# Patient Record
Sex: Female | Born: 1935 | Race: White | Hispanic: No | Smoking: Never smoker
Health system: Southern US, Community
[De-identification: ages and names within clinical notes are randomized; demographics above are authoritative.]

## PROBLEM LIST (undated history)

## (undated) DIAGNOSIS — F329 Major depressive disorder, single episode, unspecified: Secondary | ICD-10-CM

## (undated) DIAGNOSIS — Z8701 Personal history of pneumonia (recurrent): Secondary | ICD-10-CM

## (undated) DIAGNOSIS — M1990 Unspecified osteoarthritis, unspecified site: Secondary | ICD-10-CM

## (undated) DIAGNOSIS — E7800 Pure hypercholesterolemia, unspecified: Secondary | ICD-10-CM

## (undated) DIAGNOSIS — E119 Type 2 diabetes mellitus without complications: Secondary | ICD-10-CM

## (undated) DIAGNOSIS — J189 Pneumonia, unspecified organism: Secondary | ICD-10-CM

## (undated) DIAGNOSIS — G709 Myoneural disorder, unspecified: Secondary | ICD-10-CM

## (undated) DIAGNOSIS — C801 Malignant (primary) neoplasm, unspecified: Secondary | ICD-10-CM

## (undated) DIAGNOSIS — F419 Anxiety disorder, unspecified: Secondary | ICD-10-CM

## (undated) DIAGNOSIS — H3530 Unspecified macular degeneration: Secondary | ICD-10-CM

## (undated) DIAGNOSIS — F32A Depression, unspecified: Secondary | ICD-10-CM

## (undated) HISTORY — PX: TOTAL HIP ARTHROPLASTY: SHX124

## (undated) HISTORY — DX: Depression, unspecified: F32.A

## (undated) HISTORY — PX: JOINT REPLACEMENT: SHX530

## (undated) HISTORY — PX: COLONOSCOPY: SHX174

## (undated) HISTORY — PX: TUBAL LIGATION: SHX77

## (undated) HISTORY — DX: Pure hypercholesterolemia, unspecified: E78.00

## (undated) HISTORY — DX: Anxiety disorder, unspecified: F41.9

## (undated) HISTORY — DX: Type 2 diabetes mellitus without complications: E11.9

## (undated) HISTORY — PX: APPENDECTOMY: SHX54

## (undated) HISTORY — DX: Unspecified macular degeneration: H35.30

## (undated) HISTORY — DX: Major depressive disorder, single episode, unspecified: F32.9

## (undated) HISTORY — PX: REPLACEMENT TOTAL KNEE: SUR1224

## (undated) HISTORY — PX: BACK SURGERY: SHX140

---

## 2014-09-21 DIAGNOSIS — F411 Generalized anxiety disorder: Secondary | ICD-10-CM | POA: Diagnosis not present

## 2014-09-21 DIAGNOSIS — E785 Hyperlipidemia, unspecified: Secondary | ICD-10-CM | POA: Diagnosis not present

## 2014-09-21 DIAGNOSIS — F3341 Major depressive disorder, recurrent, in partial remission: Secondary | ICD-10-CM | POA: Diagnosis not present

## 2014-09-21 DIAGNOSIS — Z9181 History of falling: Secondary | ICD-10-CM | POA: Diagnosis not present

## 2014-09-21 DIAGNOSIS — H3530 Unspecified macular degeneration: Secondary | ICD-10-CM | POA: Diagnosis not present

## 2014-09-21 DIAGNOSIS — E119 Type 2 diabetes mellitus without complications: Secondary | ICD-10-CM | POA: Diagnosis not present

## 2014-09-21 DIAGNOSIS — Z0000 Encounter for general adult medical examination without abnormal findings: Secondary | ICD-10-CM | POA: Diagnosis not present

## 2014-09-30 DIAGNOSIS — H3531 Nonexudative age-related macular degeneration: Secondary | ICD-10-CM | POA: Diagnosis not present

## 2014-09-30 DIAGNOSIS — H53413 Scotoma involving central area, bilateral: Secondary | ICD-10-CM | POA: Diagnosis not present

## 2014-10-11 DIAGNOSIS — H1851 Endothelial corneal dystrophy: Secondary | ICD-10-CM | POA: Diagnosis not present

## 2014-10-11 DIAGNOSIS — H2511 Age-related nuclear cataract, right eye: Secondary | ICD-10-CM | POA: Diagnosis not present

## 2014-10-11 DIAGNOSIS — H1713 Central corneal opacity, bilateral: Secondary | ICD-10-CM | POA: Diagnosis not present

## 2014-10-11 DIAGNOSIS — E119 Type 2 diabetes mellitus without complications: Secondary | ICD-10-CM | POA: Diagnosis not present

## 2014-10-11 DIAGNOSIS — H3531 Nonexudative age-related macular degeneration: Secondary | ICD-10-CM | POA: Diagnosis not present

## 2014-10-11 DIAGNOSIS — H16223 Keratoconjunctivitis sicca, not specified as Sjogren's, bilateral: Secondary | ICD-10-CM | POA: Diagnosis not present

## 2014-10-25 DIAGNOSIS — H53419 Scotoma involving central area, unspecified eye: Secondary | ICD-10-CM | POA: Diagnosis not present

## 2014-10-25 DIAGNOSIS — H3531 Nonexudative age-related macular degeneration: Secondary | ICD-10-CM | POA: Diagnosis not present

## 2014-10-27 DIAGNOSIS — R260 Ataxic gait: Secondary | ICD-10-CM | POA: Diagnosis not present

## 2014-10-27 DIAGNOSIS — R531 Weakness: Secondary | ICD-10-CM | POA: Diagnosis not present

## 2014-10-27 DIAGNOSIS — J209 Acute bronchitis, unspecified: Secondary | ICD-10-CM | POA: Diagnosis not present

## 2014-10-27 DIAGNOSIS — F418 Other specified anxiety disorders: Secondary | ICD-10-CM | POA: Diagnosis not present

## 2014-10-27 DIAGNOSIS — R262 Difficulty in walking, not elsewhere classified: Secondary | ICD-10-CM | POA: Diagnosis not present

## 2014-10-27 DIAGNOSIS — F411 Generalized anxiety disorder: Secondary | ICD-10-CM | POA: Diagnosis not present

## 2014-10-27 DIAGNOSIS — R1011 Right upper quadrant pain: Secondary | ICD-10-CM | POA: Diagnosis not present

## 2014-10-27 DIAGNOSIS — M791 Myalgia: Secondary | ICD-10-CM | POA: Diagnosis not present

## 2014-10-27 DIAGNOSIS — Z9181 History of falling: Secondary | ICD-10-CM | POA: Diagnosis not present

## 2014-10-27 DIAGNOSIS — E119 Type 2 diabetes mellitus without complications: Secondary | ICD-10-CM | POA: Diagnosis not present

## 2014-11-04 DIAGNOSIS — J4521 Mild intermittent asthma with (acute) exacerbation: Secondary | ICD-10-CM | POA: Diagnosis not present

## 2014-11-04 DIAGNOSIS — J209 Acute bronchitis, unspecified: Secondary | ICD-10-CM | POA: Diagnosis not present

## 2014-11-04 DIAGNOSIS — J45901 Unspecified asthma with (acute) exacerbation: Secondary | ICD-10-CM | POA: Diagnosis not present

## 2014-11-04 DIAGNOSIS — R0989 Other specified symptoms and signs involving the circulatory and respiratory systems: Secondary | ICD-10-CM | POA: Diagnosis not present

## 2014-11-22 DIAGNOSIS — J301 Allergic rhinitis due to pollen: Secondary | ICD-10-CM | POA: Diagnosis not present

## 2014-11-22 DIAGNOSIS — R05 Cough: Secondary | ICD-10-CM | POA: Diagnosis not present

## 2015-01-17 DIAGNOSIS — H25811 Combined forms of age-related cataract, right eye: Secondary | ICD-10-CM | POA: Diagnosis not present

## 2015-01-21 DIAGNOSIS — H2511 Age-related nuclear cataract, right eye: Secondary | ICD-10-CM | POA: Diagnosis not present

## 2015-01-21 DIAGNOSIS — Z961 Presence of intraocular lens: Secondary | ICD-10-CM | POA: Diagnosis not present

## 2015-01-21 DIAGNOSIS — H3530 Unspecified macular degeneration: Secondary | ICD-10-CM | POA: Diagnosis not present

## 2015-01-24 DIAGNOSIS — L84 Corns and callosities: Secondary | ICD-10-CM | POA: Diagnosis not present

## 2015-01-24 DIAGNOSIS — B351 Tinea unguium: Secondary | ICD-10-CM | POA: Diagnosis not present

## 2015-01-24 DIAGNOSIS — E1140 Type 2 diabetes mellitus with diabetic neuropathy, unspecified: Secondary | ICD-10-CM | POA: Diagnosis not present

## 2015-01-27 DIAGNOSIS — H53413 Scotoma involving central area, bilateral: Secondary | ICD-10-CM | POA: Diagnosis not present

## 2015-01-27 DIAGNOSIS — H3531 Nonexudative age-related macular degeneration: Secondary | ICD-10-CM | POA: Diagnosis not present

## 2015-01-31 DIAGNOSIS — J4521 Mild intermittent asthma with (acute) exacerbation: Secondary | ICD-10-CM | POA: Diagnosis not present

## 2015-01-31 DIAGNOSIS — J301 Allergic rhinitis due to pollen: Secondary | ICD-10-CM | POA: Diagnosis not present

## 2015-01-31 DIAGNOSIS — E782 Mixed hyperlipidemia: Secondary | ICD-10-CM | POA: Diagnosis not present

## 2015-01-31 DIAGNOSIS — F411 Generalized anxiety disorder: Secondary | ICD-10-CM | POA: Diagnosis not present

## 2015-01-31 DIAGNOSIS — E119 Type 2 diabetes mellitus without complications: Secondary | ICD-10-CM | POA: Diagnosis not present

## 2015-02-02 DIAGNOSIS — E782 Mixed hyperlipidemia: Secondary | ICD-10-CM | POA: Diagnosis not present

## 2015-02-02 DIAGNOSIS — E119 Type 2 diabetes mellitus without complications: Secondary | ICD-10-CM | POA: Diagnosis not present

## 2015-04-26 DIAGNOSIS — E119 Type 2 diabetes mellitus without complications: Secondary | ICD-10-CM | POA: Diagnosis not present

## 2015-04-26 DIAGNOSIS — S61511A Laceration without foreign body of right wrist, initial encounter: Secondary | ICD-10-CM | POA: Diagnosis not present

## 2015-04-26 DIAGNOSIS — M25562 Pain in left knee: Secondary | ICD-10-CM | POA: Diagnosis not present

## 2015-05-03 DIAGNOSIS — M1712 Unilateral primary osteoarthritis, left knee: Secondary | ICD-10-CM | POA: Diagnosis not present

## 2015-05-10 DIAGNOSIS — M1712 Unilateral primary osteoarthritis, left knee: Secondary | ICD-10-CM | POA: Diagnosis not present

## 2015-05-18 DIAGNOSIS — F418 Other specified anxiety disorders: Secondary | ICD-10-CM | POA: Diagnosis not present

## 2015-05-18 DIAGNOSIS — M1712 Unilateral primary osteoarthritis, left knee: Secondary | ICD-10-CM | POA: Diagnosis not present

## 2015-05-18 DIAGNOSIS — E780 Pure hypercholesterolemia: Secondary | ICD-10-CM | POA: Diagnosis not present

## 2015-05-18 DIAGNOSIS — E119 Type 2 diabetes mellitus without complications: Secondary | ICD-10-CM | POA: Diagnosis not present

## 2015-05-25 DIAGNOSIS — L817 Pigmented purpuric dermatosis: Secondary | ICD-10-CM | POA: Diagnosis not present

## 2015-05-25 DIAGNOSIS — H31113 Age-related choroidal atrophy, bilateral: Secondary | ICD-10-CM | POA: Diagnosis not present

## 2015-05-25 DIAGNOSIS — H3531 Nonexudative age-related macular degeneration: Secondary | ICD-10-CM | POA: Diagnosis not present

## 2015-05-25 DIAGNOSIS — H2511 Age-related nuclear cataract, right eye: Secondary | ICD-10-CM | POA: Diagnosis not present

## 2015-05-25 DIAGNOSIS — Z85828 Personal history of other malignant neoplasm of skin: Secondary | ICD-10-CM | POA: Diagnosis not present

## 2015-05-25 DIAGNOSIS — H3532 Exudative age-related macular degeneration: Secondary | ICD-10-CM | POA: Diagnosis not present

## 2015-05-30 DIAGNOSIS — E780 Pure hypercholesterolemia: Secondary | ICD-10-CM | POA: Diagnosis not present

## 2015-05-30 DIAGNOSIS — E119 Type 2 diabetes mellitus without complications: Secondary | ICD-10-CM | POA: Diagnosis not present

## 2015-05-30 DIAGNOSIS — F418 Other specified anxiety disorders: Secondary | ICD-10-CM | POA: Diagnosis not present

## 2016-08-05 DIAGNOSIS — E119 Type 2 diabetes mellitus without complications: Secondary | ICD-10-CM | POA: Diagnosis not present

## 2016-08-05 DIAGNOSIS — Z23 Encounter for immunization: Secondary | ICD-10-CM | POA: Diagnosis not present

## 2016-08-05 DIAGNOSIS — Z794 Long term (current) use of insulin: Secondary | ICD-10-CM | POA: Diagnosis not present

## 2016-08-05 DIAGNOSIS — E782 Mixed hyperlipidemia: Secondary | ICD-10-CM | POA: Diagnosis not present

## 2016-08-05 DIAGNOSIS — Z0000 Encounter for general adult medical examination without abnormal findings: Secondary | ICD-10-CM | POA: Diagnosis not present

## 2016-08-06 DIAGNOSIS — H3530 Unspecified macular degeneration: Secondary | ICD-10-CM | POA: Diagnosis not present

## 2016-08-06 DIAGNOSIS — R296 Repeated falls: Secondary | ICD-10-CM | POA: Diagnosis not present

## 2016-08-06 DIAGNOSIS — E119 Type 2 diabetes mellitus without complications: Secondary | ICD-10-CM | POA: Diagnosis not present

## 2016-08-06 DIAGNOSIS — F329 Major depressive disorder, single episode, unspecified: Secondary | ICD-10-CM | POA: Diagnosis not present

## 2016-08-06 DIAGNOSIS — J4520 Mild intermittent asthma, uncomplicated: Secondary | ICD-10-CM | POA: Diagnosis not present

## 2016-08-06 DIAGNOSIS — L03012 Cellulitis of left finger: Secondary | ICD-10-CM | POA: Diagnosis not present

## 2016-08-06 DIAGNOSIS — R2681 Unsteadiness on feet: Secondary | ICD-10-CM | POA: Diagnosis not present

## 2016-08-06 DIAGNOSIS — F411 Generalized anxiety disorder: Secondary | ICD-10-CM | POA: Diagnosis not present

## 2016-08-07 DIAGNOSIS — J4520 Mild intermittent asthma, uncomplicated: Secondary | ICD-10-CM | POA: Diagnosis not present

## 2016-08-07 DIAGNOSIS — M1712 Unilateral primary osteoarthritis, left knee: Secondary | ICD-10-CM | POA: Diagnosis not present

## 2016-08-07 DIAGNOSIS — H3530 Unspecified macular degeneration: Secondary | ICD-10-CM | POA: Diagnosis not present

## 2016-08-07 DIAGNOSIS — R2681 Unsteadiness on feet: Secondary | ICD-10-CM | POA: Diagnosis not present

## 2016-08-07 DIAGNOSIS — M545 Low back pain: Secondary | ICD-10-CM | POA: Diagnosis not present

## 2016-08-07 DIAGNOSIS — R296 Repeated falls: Secondary | ICD-10-CM | POA: Diagnosis not present

## 2016-08-07 DIAGNOSIS — E119 Type 2 diabetes mellitus without complications: Secondary | ICD-10-CM | POA: Diagnosis not present

## 2016-08-07 DIAGNOSIS — L03012 Cellulitis of left finger: Secondary | ICD-10-CM | POA: Diagnosis not present

## 2016-08-12 DIAGNOSIS — L03012 Cellulitis of left finger: Secondary | ICD-10-CM | POA: Diagnosis not present

## 2016-08-12 DIAGNOSIS — E119 Type 2 diabetes mellitus without complications: Secondary | ICD-10-CM | POA: Diagnosis not present

## 2016-08-12 DIAGNOSIS — J4520 Mild intermittent asthma, uncomplicated: Secondary | ICD-10-CM | POA: Diagnosis not present

## 2016-08-12 DIAGNOSIS — R2681 Unsteadiness on feet: Secondary | ICD-10-CM | POA: Diagnosis not present

## 2016-08-12 DIAGNOSIS — H3530 Unspecified macular degeneration: Secondary | ICD-10-CM | POA: Diagnosis not present

## 2016-08-12 DIAGNOSIS — H353222 Exudative age-related macular degeneration, left eye, with inactive choroidal neovascularization: Secondary | ICD-10-CM | POA: Diagnosis not present

## 2016-08-12 DIAGNOSIS — H43811 Vitreous degeneration, right eye: Secondary | ICD-10-CM | POA: Diagnosis not present

## 2016-08-12 DIAGNOSIS — R296 Repeated falls: Secondary | ICD-10-CM | POA: Diagnosis not present

## 2016-08-12 DIAGNOSIS — H353211 Exudative age-related macular degeneration, right eye, with active choroidal neovascularization: Secondary | ICD-10-CM | POA: Diagnosis not present

## 2016-08-13 DIAGNOSIS — M48061 Spinal stenosis, lumbar region without neurogenic claudication: Secondary | ICD-10-CM | POA: Diagnosis not present

## 2016-08-13 DIAGNOSIS — M47817 Spondylosis without myelopathy or radiculopathy, lumbosacral region: Secondary | ICD-10-CM | POA: Diagnosis not present

## 2016-08-13 DIAGNOSIS — M25551 Pain in right hip: Secondary | ICD-10-CM | POA: Diagnosis not present

## 2016-08-13 DIAGNOSIS — M79604 Pain in right leg: Secondary | ICD-10-CM | POA: Diagnosis not present

## 2016-08-13 DIAGNOSIS — H353211 Exudative age-related macular degeneration, right eye, with active choroidal neovascularization: Secondary | ICD-10-CM | POA: Diagnosis not present

## 2016-08-13 DIAGNOSIS — H53413 Scotoma involving central area, bilateral: Secondary | ICD-10-CM | POA: Diagnosis not present

## 2016-08-13 DIAGNOSIS — M545 Low back pain: Secondary | ICD-10-CM | POA: Diagnosis not present

## 2016-08-13 DIAGNOSIS — M47816 Spondylosis without myelopathy or radiculopathy, lumbar region: Secondary | ICD-10-CM | POA: Diagnosis not present

## 2016-08-14 DIAGNOSIS — M1712 Unilateral primary osteoarthritis, left knee: Secondary | ICD-10-CM | POA: Diagnosis not present

## 2016-08-15 DIAGNOSIS — E119 Type 2 diabetes mellitus without complications: Secondary | ICD-10-CM | POA: Diagnosis not present

## 2016-08-15 DIAGNOSIS — J4520 Mild intermittent asthma, uncomplicated: Secondary | ICD-10-CM | POA: Diagnosis not present

## 2016-08-15 DIAGNOSIS — R2681 Unsteadiness on feet: Secondary | ICD-10-CM | POA: Diagnosis not present

## 2016-08-15 DIAGNOSIS — H3530 Unspecified macular degeneration: Secondary | ICD-10-CM | POA: Diagnosis not present

## 2016-08-15 DIAGNOSIS — R296 Repeated falls: Secondary | ICD-10-CM | POA: Diagnosis not present

## 2016-08-15 DIAGNOSIS — L03012 Cellulitis of left finger: Secondary | ICD-10-CM | POA: Diagnosis not present

## 2016-08-19 DIAGNOSIS — E119 Type 2 diabetes mellitus without complications: Secondary | ICD-10-CM | POA: Diagnosis not present

## 2016-08-19 DIAGNOSIS — H3530 Unspecified macular degeneration: Secondary | ICD-10-CM | POA: Diagnosis not present

## 2016-08-19 DIAGNOSIS — L03012 Cellulitis of left finger: Secondary | ICD-10-CM | POA: Diagnosis not present

## 2016-08-19 DIAGNOSIS — R2681 Unsteadiness on feet: Secondary | ICD-10-CM | POA: Diagnosis not present

## 2016-08-19 DIAGNOSIS — J4520 Mild intermittent asthma, uncomplicated: Secondary | ICD-10-CM | POA: Diagnosis not present

## 2016-08-19 DIAGNOSIS — R296 Repeated falls: Secondary | ICD-10-CM | POA: Diagnosis not present

## 2016-08-20 DIAGNOSIS — R296 Repeated falls: Secondary | ICD-10-CM | POA: Diagnosis not present

## 2016-08-20 DIAGNOSIS — L03012 Cellulitis of left finger: Secondary | ICD-10-CM | POA: Diagnosis not present

## 2016-08-20 DIAGNOSIS — J4520 Mild intermittent asthma, uncomplicated: Secondary | ICD-10-CM | POA: Diagnosis not present

## 2016-08-20 DIAGNOSIS — R2681 Unsteadiness on feet: Secondary | ICD-10-CM | POA: Diagnosis not present

## 2016-08-20 DIAGNOSIS — E119 Type 2 diabetes mellitus without complications: Secondary | ICD-10-CM | POA: Diagnosis not present

## 2016-08-20 DIAGNOSIS — H3530 Unspecified macular degeneration: Secondary | ICD-10-CM | POA: Diagnosis not present

## 2016-08-22 DIAGNOSIS — J4520 Mild intermittent asthma, uncomplicated: Secondary | ICD-10-CM | POA: Diagnosis not present

## 2016-08-22 DIAGNOSIS — E119 Type 2 diabetes mellitus without complications: Secondary | ICD-10-CM | POA: Diagnosis not present

## 2016-08-22 DIAGNOSIS — R2681 Unsteadiness on feet: Secondary | ICD-10-CM | POA: Diagnosis not present

## 2016-08-22 DIAGNOSIS — H3530 Unspecified macular degeneration: Secondary | ICD-10-CM | POA: Diagnosis not present

## 2016-08-22 DIAGNOSIS — L03012 Cellulitis of left finger: Secondary | ICD-10-CM | POA: Diagnosis not present

## 2016-08-22 DIAGNOSIS — M1712 Unilateral primary osteoarthritis, left knee: Secondary | ICD-10-CM | POA: Diagnosis not present

## 2016-08-22 DIAGNOSIS — R296 Repeated falls: Secondary | ICD-10-CM | POA: Diagnosis not present

## 2016-08-23 DIAGNOSIS — R296 Repeated falls: Secondary | ICD-10-CM | POA: Diagnosis not present

## 2016-08-23 DIAGNOSIS — L03012 Cellulitis of left finger: Secondary | ICD-10-CM | POA: Diagnosis not present

## 2016-08-23 DIAGNOSIS — J4520 Mild intermittent asthma, uncomplicated: Secondary | ICD-10-CM | POA: Diagnosis not present

## 2016-08-23 DIAGNOSIS — E119 Type 2 diabetes mellitus without complications: Secondary | ICD-10-CM | POA: Diagnosis not present

## 2016-08-23 DIAGNOSIS — H3530 Unspecified macular degeneration: Secondary | ICD-10-CM | POA: Diagnosis not present

## 2016-08-23 DIAGNOSIS — R2681 Unsteadiness on feet: Secondary | ICD-10-CM | POA: Diagnosis not present

## 2016-08-26 DIAGNOSIS — L03012 Cellulitis of left finger: Secondary | ICD-10-CM | POA: Diagnosis not present

## 2016-08-26 DIAGNOSIS — E119 Type 2 diabetes mellitus without complications: Secondary | ICD-10-CM | POA: Diagnosis not present

## 2016-08-26 DIAGNOSIS — R296 Repeated falls: Secondary | ICD-10-CM | POA: Diagnosis not present

## 2016-08-26 DIAGNOSIS — R2681 Unsteadiness on feet: Secondary | ICD-10-CM | POA: Diagnosis not present

## 2016-08-26 DIAGNOSIS — J4520 Mild intermittent asthma, uncomplicated: Secondary | ICD-10-CM | POA: Diagnosis not present

## 2016-08-26 DIAGNOSIS — H3530 Unspecified macular degeneration: Secondary | ICD-10-CM | POA: Diagnosis not present

## 2016-08-27 DIAGNOSIS — R2681 Unsteadiness on feet: Secondary | ICD-10-CM | POA: Diagnosis not present

## 2016-08-27 DIAGNOSIS — L03012 Cellulitis of left finger: Secondary | ICD-10-CM | POA: Diagnosis not present

## 2016-08-27 DIAGNOSIS — H3530 Unspecified macular degeneration: Secondary | ICD-10-CM | POA: Diagnosis not present

## 2016-08-27 DIAGNOSIS — J4520 Mild intermittent asthma, uncomplicated: Secondary | ICD-10-CM | POA: Diagnosis not present

## 2016-08-27 DIAGNOSIS — E119 Type 2 diabetes mellitus without complications: Secondary | ICD-10-CM | POA: Diagnosis not present

## 2016-08-27 DIAGNOSIS — R296 Repeated falls: Secondary | ICD-10-CM | POA: Diagnosis not present

## 2016-08-28 DIAGNOSIS — R296 Repeated falls: Secondary | ICD-10-CM | POA: Diagnosis not present

## 2016-08-28 DIAGNOSIS — M1712 Unilateral primary osteoarthritis, left knee: Secondary | ICD-10-CM | POA: Diagnosis not present

## 2016-08-28 DIAGNOSIS — H3530 Unspecified macular degeneration: Secondary | ICD-10-CM | POA: Diagnosis not present

## 2016-08-28 DIAGNOSIS — L03012 Cellulitis of left finger: Secondary | ICD-10-CM | POA: Diagnosis not present

## 2016-08-28 DIAGNOSIS — E119 Type 2 diabetes mellitus without complications: Secondary | ICD-10-CM | POA: Diagnosis not present

## 2016-08-28 DIAGNOSIS — R2681 Unsteadiness on feet: Secondary | ICD-10-CM | POA: Diagnosis not present

## 2016-08-28 DIAGNOSIS — J4520 Mild intermittent asthma, uncomplicated: Secondary | ICD-10-CM | POA: Diagnosis not present

## 2016-08-29 DIAGNOSIS — H3530 Unspecified macular degeneration: Secondary | ICD-10-CM | POA: Diagnosis not present

## 2016-08-29 DIAGNOSIS — R296 Repeated falls: Secondary | ICD-10-CM | POA: Diagnosis not present

## 2016-08-29 DIAGNOSIS — J4520 Mild intermittent asthma, uncomplicated: Secondary | ICD-10-CM | POA: Diagnosis not present

## 2016-08-29 DIAGNOSIS — E119 Type 2 diabetes mellitus without complications: Secondary | ICD-10-CM | POA: Diagnosis not present

## 2016-08-29 DIAGNOSIS — R2681 Unsteadiness on feet: Secondary | ICD-10-CM | POA: Diagnosis not present

## 2016-08-29 DIAGNOSIS — L03012 Cellulitis of left finger: Secondary | ICD-10-CM | POA: Diagnosis not present

## 2016-08-30 DIAGNOSIS — R296 Repeated falls: Secondary | ICD-10-CM | POA: Diagnosis not present

## 2016-08-30 DIAGNOSIS — L03012 Cellulitis of left finger: Secondary | ICD-10-CM | POA: Diagnosis not present

## 2016-08-30 DIAGNOSIS — J4520 Mild intermittent asthma, uncomplicated: Secondary | ICD-10-CM | POA: Diagnosis not present

## 2016-08-30 DIAGNOSIS — E119 Type 2 diabetes mellitus without complications: Secondary | ICD-10-CM | POA: Diagnosis not present

## 2016-08-30 DIAGNOSIS — R2681 Unsteadiness on feet: Secondary | ICD-10-CM | POA: Diagnosis not present

## 2016-08-30 DIAGNOSIS — H3530 Unspecified macular degeneration: Secondary | ICD-10-CM | POA: Diagnosis not present

## 2016-09-02 DIAGNOSIS — M4807 Spinal stenosis, lumbosacral region: Secondary | ICD-10-CM | POA: Diagnosis not present

## 2016-09-02 DIAGNOSIS — L03012 Cellulitis of left finger: Secondary | ICD-10-CM | POA: Diagnosis not present

## 2016-09-02 DIAGNOSIS — E119 Type 2 diabetes mellitus without complications: Secondary | ICD-10-CM | POA: Diagnosis not present

## 2016-09-02 DIAGNOSIS — H3530 Unspecified macular degeneration: Secondary | ICD-10-CM | POA: Diagnosis not present

## 2016-09-02 DIAGNOSIS — M4726 Other spondylosis with radiculopathy, lumbar region: Secondary | ICD-10-CM | POA: Diagnosis not present

## 2016-09-02 DIAGNOSIS — R296 Repeated falls: Secondary | ICD-10-CM | POA: Diagnosis not present

## 2016-09-02 DIAGNOSIS — R2681 Unsteadiness on feet: Secondary | ICD-10-CM | POA: Diagnosis not present

## 2016-09-02 DIAGNOSIS — J4520 Mild intermittent asthma, uncomplicated: Secondary | ICD-10-CM | POA: Diagnosis not present

## 2016-09-02 DIAGNOSIS — M4727 Other spondylosis with radiculopathy, lumbosacral region: Secondary | ICD-10-CM | POA: Diagnosis not present

## 2016-09-03 DIAGNOSIS — R296 Repeated falls: Secondary | ICD-10-CM | POA: Diagnosis not present

## 2016-09-03 DIAGNOSIS — L03012 Cellulitis of left finger: Secondary | ICD-10-CM | POA: Diagnosis not present

## 2016-09-03 DIAGNOSIS — J4520 Mild intermittent asthma, uncomplicated: Secondary | ICD-10-CM | POA: Diagnosis not present

## 2016-09-03 DIAGNOSIS — H3530 Unspecified macular degeneration: Secondary | ICD-10-CM | POA: Diagnosis not present

## 2016-09-03 DIAGNOSIS — E119 Type 2 diabetes mellitus without complications: Secondary | ICD-10-CM | POA: Diagnosis not present

## 2016-09-03 DIAGNOSIS — R2681 Unsteadiness on feet: Secondary | ICD-10-CM | POA: Diagnosis not present

## 2016-09-04 DIAGNOSIS — R296 Repeated falls: Secondary | ICD-10-CM | POA: Diagnosis not present

## 2016-09-04 DIAGNOSIS — J4520 Mild intermittent asthma, uncomplicated: Secondary | ICD-10-CM | POA: Diagnosis not present

## 2016-09-04 DIAGNOSIS — L03012 Cellulitis of left finger: Secondary | ICD-10-CM | POA: Diagnosis not present

## 2016-09-04 DIAGNOSIS — E119 Type 2 diabetes mellitus without complications: Secondary | ICD-10-CM | POA: Diagnosis not present

## 2016-09-04 DIAGNOSIS — R2681 Unsteadiness on feet: Secondary | ICD-10-CM | POA: Diagnosis not present

## 2016-09-04 DIAGNOSIS — H3530 Unspecified macular degeneration: Secondary | ICD-10-CM | POA: Diagnosis not present

## 2016-09-06 DIAGNOSIS — M5416 Radiculopathy, lumbar region: Secondary | ICD-10-CM | POA: Diagnosis not present

## 2016-09-11 DIAGNOSIS — R2681 Unsteadiness on feet: Secondary | ICD-10-CM | POA: Diagnosis not present

## 2016-09-11 DIAGNOSIS — R296 Repeated falls: Secondary | ICD-10-CM | POA: Diagnosis not present

## 2016-09-11 DIAGNOSIS — J4520 Mild intermittent asthma, uncomplicated: Secondary | ICD-10-CM | POA: Diagnosis not present

## 2016-09-11 DIAGNOSIS — H3530 Unspecified macular degeneration: Secondary | ICD-10-CM | POA: Diagnosis not present

## 2016-09-11 DIAGNOSIS — E119 Type 2 diabetes mellitus without complications: Secondary | ICD-10-CM | POA: Diagnosis not present

## 2016-09-11 DIAGNOSIS — L03012 Cellulitis of left finger: Secondary | ICD-10-CM | POA: Diagnosis not present

## 2016-09-18 DIAGNOSIS — J4520 Mild intermittent asthma, uncomplicated: Secondary | ICD-10-CM | POA: Diagnosis not present

## 2016-09-18 DIAGNOSIS — H3530 Unspecified macular degeneration: Secondary | ICD-10-CM | POA: Diagnosis not present

## 2016-09-18 DIAGNOSIS — E119 Type 2 diabetes mellitus without complications: Secondary | ICD-10-CM | POA: Diagnosis not present

## 2016-09-18 DIAGNOSIS — R296 Repeated falls: Secondary | ICD-10-CM | POA: Diagnosis not present

## 2016-09-18 DIAGNOSIS — L03012 Cellulitis of left finger: Secondary | ICD-10-CM | POA: Diagnosis not present

## 2016-09-18 DIAGNOSIS — R2681 Unsteadiness on feet: Secondary | ICD-10-CM | POA: Diagnosis not present

## 2016-09-19 DIAGNOSIS — R296 Repeated falls: Secondary | ICD-10-CM | POA: Diagnosis not present

## 2016-09-19 DIAGNOSIS — E119 Type 2 diabetes mellitus without complications: Secondary | ICD-10-CM | POA: Diagnosis not present

## 2016-09-19 DIAGNOSIS — J4520 Mild intermittent asthma, uncomplicated: Secondary | ICD-10-CM | POA: Diagnosis not present

## 2016-09-19 DIAGNOSIS — R2681 Unsteadiness on feet: Secondary | ICD-10-CM | POA: Diagnosis not present

## 2016-09-19 DIAGNOSIS — H3530 Unspecified macular degeneration: Secondary | ICD-10-CM | POA: Diagnosis not present

## 2016-09-19 DIAGNOSIS — L03012 Cellulitis of left finger: Secondary | ICD-10-CM | POA: Diagnosis not present

## 2016-09-20 DIAGNOSIS — H353211 Exudative age-related macular degeneration, right eye, with active choroidal neovascularization: Secondary | ICD-10-CM | POA: Diagnosis not present

## 2016-09-23 DIAGNOSIS — R2681 Unsteadiness on feet: Secondary | ICD-10-CM | POA: Diagnosis not present

## 2016-09-23 DIAGNOSIS — J069 Acute upper respiratory infection, unspecified: Secondary | ICD-10-CM | POA: Diagnosis not present

## 2016-09-23 DIAGNOSIS — J301 Allergic rhinitis due to pollen: Secondary | ICD-10-CM | POA: Diagnosis not present

## 2016-09-23 DIAGNOSIS — E1165 Type 2 diabetes mellitus with hyperglycemia: Secondary | ICD-10-CM | POA: Diagnosis not present

## 2016-09-23 DIAGNOSIS — H10501 Unspecified blepharoconjunctivitis, right eye: Secondary | ICD-10-CM | POA: Diagnosis not present

## 2016-09-24 DIAGNOSIS — J4520 Mild intermittent asthma, uncomplicated: Secondary | ICD-10-CM | POA: Diagnosis not present

## 2016-09-24 DIAGNOSIS — H3530 Unspecified macular degeneration: Secondary | ICD-10-CM | POA: Diagnosis not present

## 2016-09-24 DIAGNOSIS — R2681 Unsteadiness on feet: Secondary | ICD-10-CM | POA: Diagnosis not present

## 2016-09-24 DIAGNOSIS — E119 Type 2 diabetes mellitus without complications: Secondary | ICD-10-CM | POA: Diagnosis not present

## 2016-09-24 DIAGNOSIS — L03012 Cellulitis of left finger: Secondary | ICD-10-CM | POA: Diagnosis not present

## 2016-09-24 DIAGNOSIS — R296 Repeated falls: Secondary | ICD-10-CM | POA: Diagnosis not present

## 2016-09-25 DIAGNOSIS — L03012 Cellulitis of left finger: Secondary | ICD-10-CM | POA: Diagnosis not present

## 2016-09-25 DIAGNOSIS — E119 Type 2 diabetes mellitus without complications: Secondary | ICD-10-CM | POA: Diagnosis not present

## 2016-09-25 DIAGNOSIS — J4520 Mild intermittent asthma, uncomplicated: Secondary | ICD-10-CM | POA: Diagnosis not present

## 2016-09-25 DIAGNOSIS — H3530 Unspecified macular degeneration: Secondary | ICD-10-CM | POA: Diagnosis not present

## 2016-09-25 DIAGNOSIS — R2681 Unsteadiness on feet: Secondary | ICD-10-CM | POA: Diagnosis not present

## 2016-09-25 DIAGNOSIS — R296 Repeated falls: Secondary | ICD-10-CM | POA: Diagnosis not present

## 2016-09-26 DIAGNOSIS — R2681 Unsteadiness on feet: Secondary | ICD-10-CM | POA: Diagnosis not present

## 2016-09-26 DIAGNOSIS — L03012 Cellulitis of left finger: Secondary | ICD-10-CM | POA: Diagnosis not present

## 2016-09-26 DIAGNOSIS — H3530 Unspecified macular degeneration: Secondary | ICD-10-CM | POA: Diagnosis not present

## 2016-09-26 DIAGNOSIS — R296 Repeated falls: Secondary | ICD-10-CM | POA: Diagnosis not present

## 2016-09-26 DIAGNOSIS — E119 Type 2 diabetes mellitus without complications: Secondary | ICD-10-CM | POA: Diagnosis not present

## 2016-09-26 DIAGNOSIS — J4520 Mild intermittent asthma, uncomplicated: Secondary | ICD-10-CM | POA: Diagnosis not present

## 2016-09-27 DIAGNOSIS — M5416 Radiculopathy, lumbar region: Secondary | ICD-10-CM | POA: Diagnosis not present

## 2016-09-27 DIAGNOSIS — G894 Chronic pain syndrome: Secondary | ICD-10-CM | POA: Diagnosis not present

## 2016-10-02 DIAGNOSIS — R296 Repeated falls: Secondary | ICD-10-CM | POA: Diagnosis not present

## 2016-10-02 DIAGNOSIS — R2681 Unsteadiness on feet: Secondary | ICD-10-CM | POA: Diagnosis not present

## 2016-10-02 DIAGNOSIS — H3530 Unspecified macular degeneration: Secondary | ICD-10-CM | POA: Diagnosis not present

## 2016-10-02 DIAGNOSIS — J4520 Mild intermittent asthma, uncomplicated: Secondary | ICD-10-CM | POA: Diagnosis not present

## 2016-10-02 DIAGNOSIS — E119 Type 2 diabetes mellitus without complications: Secondary | ICD-10-CM | POA: Diagnosis not present

## 2016-10-02 DIAGNOSIS — L03012 Cellulitis of left finger: Secondary | ICD-10-CM | POA: Diagnosis not present

## 2016-10-03 DIAGNOSIS — M4726 Other spondylosis with radiculopathy, lumbar region: Secondary | ICD-10-CM | POA: Diagnosis not present

## 2016-10-03 DIAGNOSIS — R296 Repeated falls: Secondary | ICD-10-CM | POA: Diagnosis not present

## 2016-10-03 DIAGNOSIS — R2681 Unsteadiness on feet: Secondary | ICD-10-CM | POA: Diagnosis not present

## 2016-10-03 DIAGNOSIS — M4317 Spondylolisthesis, lumbosacral region: Secondary | ICD-10-CM | POA: Diagnosis not present

## 2016-10-03 DIAGNOSIS — M48061 Spinal stenosis, lumbar region without neurogenic claudication: Secondary | ICD-10-CM | POA: Diagnosis not present

## 2016-10-03 DIAGNOSIS — M4316 Spondylolisthesis, lumbar region: Secondary | ICD-10-CM | POA: Diagnosis not present

## 2016-10-03 DIAGNOSIS — E119 Type 2 diabetes mellitus without complications: Secondary | ICD-10-CM | POA: Diagnosis not present

## 2016-10-03 DIAGNOSIS — L03012 Cellulitis of left finger: Secondary | ICD-10-CM | POA: Diagnosis not present

## 2016-10-03 DIAGNOSIS — M4727 Other spondylosis with radiculopathy, lumbosacral region: Secondary | ICD-10-CM | POA: Diagnosis not present

## 2016-10-03 DIAGNOSIS — H3530 Unspecified macular degeneration: Secondary | ICD-10-CM | POA: Diagnosis not present

## 2016-10-03 DIAGNOSIS — J4520 Mild intermittent asthma, uncomplicated: Secondary | ICD-10-CM | POA: Diagnosis not present

## 2016-10-04 DIAGNOSIS — R296 Repeated falls: Secondary | ICD-10-CM | POA: Diagnosis not present

## 2016-10-04 DIAGNOSIS — E119 Type 2 diabetes mellitus without complications: Secondary | ICD-10-CM | POA: Diagnosis not present

## 2016-10-04 DIAGNOSIS — L03012 Cellulitis of left finger: Secondary | ICD-10-CM | POA: Diagnosis not present

## 2016-10-04 DIAGNOSIS — R2681 Unsteadiness on feet: Secondary | ICD-10-CM | POA: Diagnosis not present

## 2016-10-04 DIAGNOSIS — H3530 Unspecified macular degeneration: Secondary | ICD-10-CM | POA: Diagnosis not present

## 2016-10-04 DIAGNOSIS — J4520 Mild intermittent asthma, uncomplicated: Secondary | ICD-10-CM | POA: Diagnosis not present

## 2016-10-07 DIAGNOSIS — M5126 Other intervertebral disc displacement, lumbar region: Secondary | ICD-10-CM | POA: Diagnosis not present

## 2016-10-07 DIAGNOSIS — M5416 Radiculopathy, lumbar region: Secondary | ICD-10-CM | POA: Diagnosis not present

## 2016-10-07 DIAGNOSIS — G894 Chronic pain syndrome: Secondary | ICD-10-CM | POA: Diagnosis not present

## 2016-10-18 DIAGNOSIS — R262 Difficulty in walking, not elsewhere classified: Secondary | ICD-10-CM | POA: Diagnosis not present

## 2016-10-18 DIAGNOSIS — E119 Type 2 diabetes mellitus without complications: Secondary | ICD-10-CM | POA: Diagnosis not present

## 2016-10-18 DIAGNOSIS — Z7984 Long term (current) use of oral hypoglycemic drugs: Secondary | ICD-10-CM | POA: Diagnosis not present

## 2016-10-18 DIAGNOSIS — R2689 Other abnormalities of gait and mobility: Secondary | ICD-10-CM | POA: Diagnosis not present

## 2016-10-18 DIAGNOSIS — E1165 Type 2 diabetes mellitus with hyperglycemia: Secondary | ICD-10-CM | POA: Diagnosis not present

## 2016-10-18 DIAGNOSIS — M549 Dorsalgia, unspecified: Secondary | ICD-10-CM | POA: Diagnosis not present

## 2016-10-18 DIAGNOSIS — M5416 Radiculopathy, lumbar region: Secondary | ICD-10-CM | POA: Diagnosis not present

## 2016-10-18 DIAGNOSIS — R7309 Other abnormal glucose: Secondary | ICD-10-CM | POA: Diagnosis not present

## 2016-10-21 ENCOUNTER — Other Ambulatory Visit: Payer: Self-pay | Admitting: Orthopedic Surgery

## 2016-10-21 DIAGNOSIS — M5416 Radiculopathy, lumbar region: Secondary | ICD-10-CM

## 2016-10-23 DIAGNOSIS — Z9181 History of falling: Secondary | ICD-10-CM | POA: Diagnosis not present

## 2016-10-23 DIAGNOSIS — R2681 Unsteadiness on feet: Secondary | ICD-10-CM | POA: Diagnosis not present

## 2016-10-23 DIAGNOSIS — Z7984 Long term (current) use of oral hypoglycemic drugs: Secondary | ICD-10-CM | POA: Diagnosis not present

## 2016-10-23 DIAGNOSIS — E119 Type 2 diabetes mellitus without complications: Secondary | ICD-10-CM | POA: Diagnosis not present

## 2016-10-23 DIAGNOSIS — M5431 Sciatica, right side: Secondary | ICD-10-CM | POA: Diagnosis not present

## 2016-10-23 DIAGNOSIS — R2689 Other abnormalities of gait and mobility: Secondary | ICD-10-CM | POA: Diagnosis not present

## 2016-10-24 DIAGNOSIS — Z9181 History of falling: Secondary | ICD-10-CM | POA: Diagnosis not present

## 2016-10-24 DIAGNOSIS — R2681 Unsteadiness on feet: Secondary | ICD-10-CM | POA: Diagnosis not present

## 2016-10-24 DIAGNOSIS — E119 Type 2 diabetes mellitus without complications: Secondary | ICD-10-CM | POA: Diagnosis not present

## 2016-10-24 DIAGNOSIS — R2689 Other abnormalities of gait and mobility: Secondary | ICD-10-CM | POA: Diagnosis not present

## 2016-10-24 DIAGNOSIS — Z7984 Long term (current) use of oral hypoglycemic drugs: Secondary | ICD-10-CM | POA: Diagnosis not present

## 2016-10-24 DIAGNOSIS — M5431 Sciatica, right side: Secondary | ICD-10-CM | POA: Diagnosis not present

## 2016-10-26 ENCOUNTER — Ambulatory Visit
Admission: RE | Admit: 2016-10-26 | Discharge: 2016-10-26 | Disposition: A | Discharge status: Home / Self Care | Payer: Medicare Other | Source: Ambulatory Visit | Attending: Orthopedic Surgery | Admitting: Orthopedic Surgery

## 2016-10-26 DIAGNOSIS — M5416 Radiculopathy, lumbar region: Secondary | ICD-10-CM

## 2016-10-26 DIAGNOSIS — M5126 Other intervertebral disc displacement, lumbar region: Secondary | ICD-10-CM | POA: Diagnosis not present

## 2016-10-28 DIAGNOSIS — R2681 Unsteadiness on feet: Secondary | ICD-10-CM | POA: Diagnosis not present

## 2016-10-28 DIAGNOSIS — R2689 Other abnormalities of gait and mobility: Secondary | ICD-10-CM | POA: Diagnosis not present

## 2016-10-28 DIAGNOSIS — E119 Type 2 diabetes mellitus without complications: Secondary | ICD-10-CM | POA: Diagnosis not present

## 2016-10-28 DIAGNOSIS — Z7984 Long term (current) use of oral hypoglycemic drugs: Secondary | ICD-10-CM | POA: Diagnosis not present

## 2016-10-28 DIAGNOSIS — Z9181 History of falling: Secondary | ICD-10-CM | POA: Diagnosis not present

## 2016-10-28 DIAGNOSIS — M5416 Radiculopathy, lumbar region: Secondary | ICD-10-CM | POA: Diagnosis not present

## 2016-10-28 DIAGNOSIS — M5431 Sciatica, right side: Secondary | ICD-10-CM | POA: Diagnosis not present

## 2016-10-30 DIAGNOSIS — M5431 Sciatica, right side: Secondary | ICD-10-CM | POA: Diagnosis not present

## 2016-10-30 DIAGNOSIS — E119 Type 2 diabetes mellitus without complications: Secondary | ICD-10-CM | POA: Diagnosis not present

## 2016-10-30 DIAGNOSIS — R2689 Other abnormalities of gait and mobility: Secondary | ICD-10-CM | POA: Diagnosis not present

## 2016-10-30 DIAGNOSIS — Z9181 History of falling: Secondary | ICD-10-CM | POA: Diagnosis not present

## 2016-10-30 DIAGNOSIS — Z7984 Long term (current) use of oral hypoglycemic drugs: Secondary | ICD-10-CM | POA: Diagnosis not present

## 2016-10-30 DIAGNOSIS — R2681 Unsteadiness on feet: Secondary | ICD-10-CM | POA: Diagnosis not present

## 2016-10-31 DIAGNOSIS — R2689 Other abnormalities of gait and mobility: Secondary | ICD-10-CM | POA: Diagnosis not present

## 2016-10-31 DIAGNOSIS — Z9181 History of falling: Secondary | ICD-10-CM | POA: Diagnosis not present

## 2016-10-31 DIAGNOSIS — Z7984 Long term (current) use of oral hypoglycemic drugs: Secondary | ICD-10-CM | POA: Diagnosis not present

## 2016-10-31 DIAGNOSIS — M5431 Sciatica, right side: Secondary | ICD-10-CM | POA: Diagnosis not present

## 2016-10-31 DIAGNOSIS — E119 Type 2 diabetes mellitus without complications: Secondary | ICD-10-CM | POA: Diagnosis not present

## 2016-10-31 DIAGNOSIS — R2681 Unsteadiness on feet: Secondary | ICD-10-CM | POA: Diagnosis not present

## 2016-11-04 DIAGNOSIS — M5431 Sciatica, right side: Secondary | ICD-10-CM | POA: Diagnosis not present

## 2016-11-04 DIAGNOSIS — R2681 Unsteadiness on feet: Secondary | ICD-10-CM | POA: Diagnosis not present

## 2016-11-04 DIAGNOSIS — Z9181 History of falling: Secondary | ICD-10-CM | POA: Diagnosis not present

## 2016-11-04 DIAGNOSIS — E119 Type 2 diabetes mellitus without complications: Secondary | ICD-10-CM | POA: Diagnosis not present

## 2016-11-04 DIAGNOSIS — Z7984 Long term (current) use of oral hypoglycemic drugs: Secondary | ICD-10-CM | POA: Diagnosis not present

## 2016-11-04 DIAGNOSIS — M5416 Radiculopathy, lumbar region: Secondary | ICD-10-CM | POA: Diagnosis not present

## 2016-11-04 DIAGNOSIS — R2689 Other abnormalities of gait and mobility: Secondary | ICD-10-CM | POA: Diagnosis not present

## 2016-11-05 DIAGNOSIS — Z7984 Long term (current) use of oral hypoglycemic drugs: Secondary | ICD-10-CM | POA: Diagnosis not present

## 2016-11-05 DIAGNOSIS — R2681 Unsteadiness on feet: Secondary | ICD-10-CM | POA: Diagnosis not present

## 2016-11-05 DIAGNOSIS — Z9181 History of falling: Secondary | ICD-10-CM | POA: Diagnosis not present

## 2016-11-05 DIAGNOSIS — E119 Type 2 diabetes mellitus without complications: Secondary | ICD-10-CM | POA: Diagnosis not present

## 2016-11-05 DIAGNOSIS — M5431 Sciatica, right side: Secondary | ICD-10-CM | POA: Diagnosis not present

## 2016-11-05 DIAGNOSIS — R2689 Other abnormalities of gait and mobility: Secondary | ICD-10-CM | POA: Diagnosis not present

## 2016-11-06 DIAGNOSIS — E119 Type 2 diabetes mellitus without complications: Secondary | ICD-10-CM | POA: Diagnosis not present

## 2016-11-06 DIAGNOSIS — Z9181 History of falling: Secondary | ICD-10-CM | POA: Diagnosis not present

## 2016-11-06 DIAGNOSIS — Z7984 Long term (current) use of oral hypoglycemic drugs: Secondary | ICD-10-CM | POA: Diagnosis not present

## 2016-11-06 DIAGNOSIS — M5431 Sciatica, right side: Secondary | ICD-10-CM | POA: Diagnosis not present

## 2016-11-06 DIAGNOSIS — R2689 Other abnormalities of gait and mobility: Secondary | ICD-10-CM | POA: Diagnosis not present

## 2016-11-06 DIAGNOSIS — R2681 Unsteadiness on feet: Secondary | ICD-10-CM | POA: Diagnosis not present

## 2016-11-07 DIAGNOSIS — Z9181 History of falling: Secondary | ICD-10-CM | POA: Diagnosis not present

## 2016-11-07 DIAGNOSIS — R2689 Other abnormalities of gait and mobility: Secondary | ICD-10-CM | POA: Diagnosis not present

## 2016-11-07 DIAGNOSIS — M5431 Sciatica, right side: Secondary | ICD-10-CM | POA: Diagnosis not present

## 2016-11-07 DIAGNOSIS — Z7984 Long term (current) use of oral hypoglycemic drugs: Secondary | ICD-10-CM | POA: Diagnosis not present

## 2016-11-07 DIAGNOSIS — R2681 Unsteadiness on feet: Secondary | ICD-10-CM | POA: Diagnosis not present

## 2016-11-07 DIAGNOSIS — E119 Type 2 diabetes mellitus without complications: Secondary | ICD-10-CM | POA: Diagnosis not present

## 2016-11-08 DIAGNOSIS — E119 Type 2 diabetes mellitus without complications: Secondary | ICD-10-CM | POA: Diagnosis not present

## 2016-11-08 DIAGNOSIS — M5431 Sciatica, right side: Secondary | ICD-10-CM | POA: Diagnosis not present

## 2016-11-08 DIAGNOSIS — R2681 Unsteadiness on feet: Secondary | ICD-10-CM | POA: Diagnosis not present

## 2016-11-08 DIAGNOSIS — Z9181 History of falling: Secondary | ICD-10-CM | POA: Diagnosis not present

## 2016-11-08 DIAGNOSIS — R2689 Other abnormalities of gait and mobility: Secondary | ICD-10-CM | POA: Diagnosis not present

## 2016-11-08 DIAGNOSIS — Z7984 Long term (current) use of oral hypoglycemic drugs: Secondary | ICD-10-CM | POA: Diagnosis not present

## 2016-11-11 DIAGNOSIS — R2681 Unsteadiness on feet: Secondary | ICD-10-CM | POA: Diagnosis not present

## 2016-11-11 DIAGNOSIS — M5431 Sciatica, right side: Secondary | ICD-10-CM | POA: Diagnosis not present

## 2016-11-11 DIAGNOSIS — Z7984 Long term (current) use of oral hypoglycemic drugs: Secondary | ICD-10-CM | POA: Diagnosis not present

## 2016-11-11 DIAGNOSIS — E119 Type 2 diabetes mellitus without complications: Secondary | ICD-10-CM | POA: Diagnosis not present

## 2016-11-11 DIAGNOSIS — R2689 Other abnormalities of gait and mobility: Secondary | ICD-10-CM | POA: Diagnosis not present

## 2016-11-11 DIAGNOSIS — Z9181 History of falling: Secondary | ICD-10-CM | POA: Diagnosis not present

## 2016-11-12 DIAGNOSIS — Z7984 Long term (current) use of oral hypoglycemic drugs: Secondary | ICD-10-CM | POA: Diagnosis not present

## 2016-11-12 DIAGNOSIS — Z9181 History of falling: Secondary | ICD-10-CM | POA: Diagnosis not present

## 2016-11-12 DIAGNOSIS — M5431 Sciatica, right side: Secondary | ICD-10-CM | POA: Diagnosis not present

## 2016-11-12 DIAGNOSIS — R2681 Unsteadiness on feet: Secondary | ICD-10-CM | POA: Diagnosis not present

## 2016-11-12 DIAGNOSIS — R2689 Other abnormalities of gait and mobility: Secondary | ICD-10-CM | POA: Diagnosis not present

## 2016-11-12 DIAGNOSIS — E119 Type 2 diabetes mellitus without complications: Secondary | ICD-10-CM | POA: Diagnosis not present

## 2016-11-14 DIAGNOSIS — E119 Type 2 diabetes mellitus without complications: Secondary | ICD-10-CM | POA: Diagnosis not present

## 2016-11-14 DIAGNOSIS — R2689 Other abnormalities of gait and mobility: Secondary | ICD-10-CM | POA: Diagnosis not present

## 2016-11-14 DIAGNOSIS — Z9181 History of falling: Secondary | ICD-10-CM | POA: Diagnosis not present

## 2016-11-14 DIAGNOSIS — M5431 Sciatica, right side: Secondary | ICD-10-CM | POA: Diagnosis not present

## 2016-11-14 DIAGNOSIS — Z7984 Long term (current) use of oral hypoglycemic drugs: Secondary | ICD-10-CM | POA: Diagnosis not present

## 2016-11-14 DIAGNOSIS — R2681 Unsteadiness on feet: Secondary | ICD-10-CM | POA: Diagnosis not present

## 2016-11-18 DIAGNOSIS — R2689 Other abnormalities of gait and mobility: Secondary | ICD-10-CM | POA: Diagnosis not present

## 2016-11-18 DIAGNOSIS — R2681 Unsteadiness on feet: Secondary | ICD-10-CM | POA: Diagnosis not present

## 2016-11-18 DIAGNOSIS — Z7984 Long term (current) use of oral hypoglycemic drugs: Secondary | ICD-10-CM | POA: Diagnosis not present

## 2016-11-18 DIAGNOSIS — Z9181 History of falling: Secondary | ICD-10-CM | POA: Diagnosis not present

## 2016-11-18 DIAGNOSIS — M5431 Sciatica, right side: Secondary | ICD-10-CM | POA: Diagnosis not present

## 2016-11-18 DIAGNOSIS — E119 Type 2 diabetes mellitus without complications: Secondary | ICD-10-CM | POA: Diagnosis not present

## 2016-11-19 DIAGNOSIS — M5416 Radiculopathy, lumbar region: Secondary | ICD-10-CM | POA: Diagnosis not present

## 2016-11-20 DIAGNOSIS — Z9181 History of falling: Secondary | ICD-10-CM | POA: Diagnosis not present

## 2016-11-20 DIAGNOSIS — R2681 Unsteadiness on feet: Secondary | ICD-10-CM | POA: Diagnosis not present

## 2016-11-20 DIAGNOSIS — Z7984 Long term (current) use of oral hypoglycemic drugs: Secondary | ICD-10-CM | POA: Diagnosis not present

## 2016-11-20 DIAGNOSIS — E119 Type 2 diabetes mellitus without complications: Secondary | ICD-10-CM | POA: Diagnosis not present

## 2016-11-20 DIAGNOSIS — M5416 Radiculopathy, lumbar region: Secondary | ICD-10-CM | POA: Diagnosis not present

## 2016-11-20 DIAGNOSIS — R2689 Other abnormalities of gait and mobility: Secondary | ICD-10-CM | POA: Diagnosis not present

## 2016-11-20 DIAGNOSIS — M5431 Sciatica, right side: Secondary | ICD-10-CM | POA: Diagnosis not present

## 2016-11-21 DIAGNOSIS — R2689 Other abnormalities of gait and mobility: Secondary | ICD-10-CM | POA: Diagnosis not present

## 2016-11-21 DIAGNOSIS — Z7984 Long term (current) use of oral hypoglycemic drugs: Secondary | ICD-10-CM | POA: Diagnosis not present

## 2016-11-21 DIAGNOSIS — M5431 Sciatica, right side: Secondary | ICD-10-CM | POA: Diagnosis not present

## 2016-11-21 DIAGNOSIS — Z9181 History of falling: Secondary | ICD-10-CM | POA: Diagnosis not present

## 2016-11-21 DIAGNOSIS — E119 Type 2 diabetes mellitus without complications: Secondary | ICD-10-CM | POA: Diagnosis not present

## 2016-11-21 DIAGNOSIS — R2681 Unsteadiness on feet: Secondary | ICD-10-CM | POA: Diagnosis not present

## 2016-11-22 DIAGNOSIS — M5431 Sciatica, right side: Secondary | ICD-10-CM | POA: Diagnosis not present

## 2016-11-22 DIAGNOSIS — E119 Type 2 diabetes mellitus without complications: Secondary | ICD-10-CM | POA: Diagnosis not present

## 2016-11-22 DIAGNOSIS — Z9181 History of falling: Secondary | ICD-10-CM | POA: Diagnosis not present

## 2016-11-22 DIAGNOSIS — R2681 Unsteadiness on feet: Secondary | ICD-10-CM | POA: Diagnosis not present

## 2016-11-22 DIAGNOSIS — R2689 Other abnormalities of gait and mobility: Secondary | ICD-10-CM | POA: Diagnosis not present

## 2016-11-22 DIAGNOSIS — Z7984 Long term (current) use of oral hypoglycemic drugs: Secondary | ICD-10-CM | POA: Diagnosis not present

## 2016-11-26 DIAGNOSIS — Z9181 History of falling: Secondary | ICD-10-CM | POA: Diagnosis not present

## 2016-11-26 DIAGNOSIS — Z7984 Long term (current) use of oral hypoglycemic drugs: Secondary | ICD-10-CM | POA: Diagnosis not present

## 2016-11-26 DIAGNOSIS — M5431 Sciatica, right side: Secondary | ICD-10-CM | POA: Diagnosis not present

## 2016-11-26 DIAGNOSIS — E119 Type 2 diabetes mellitus without complications: Secondary | ICD-10-CM | POA: Diagnosis not present

## 2016-11-26 DIAGNOSIS — R2689 Other abnormalities of gait and mobility: Secondary | ICD-10-CM | POA: Diagnosis not present

## 2016-11-26 DIAGNOSIS — R2681 Unsteadiness on feet: Secondary | ICD-10-CM | POA: Diagnosis not present

## 2016-11-27 DIAGNOSIS — Z9181 History of falling: Secondary | ICD-10-CM | POA: Diagnosis not present

## 2016-11-27 DIAGNOSIS — Z7984 Long term (current) use of oral hypoglycemic drugs: Secondary | ICD-10-CM | POA: Diagnosis not present

## 2016-11-27 DIAGNOSIS — R2681 Unsteadiness on feet: Secondary | ICD-10-CM | POA: Diagnosis not present

## 2016-11-27 DIAGNOSIS — M5431 Sciatica, right side: Secondary | ICD-10-CM | POA: Diagnosis not present

## 2016-11-27 DIAGNOSIS — R2689 Other abnormalities of gait and mobility: Secondary | ICD-10-CM | POA: Diagnosis not present

## 2016-11-27 DIAGNOSIS — E119 Type 2 diabetes mellitus without complications: Secondary | ICD-10-CM | POA: Diagnosis not present

## 2016-11-28 ENCOUNTER — Encounter (INDEPENDENT_AMBULATORY_CARE_PROVIDER_SITE_OTHER): Payer: Self-pay

## 2016-11-28 ENCOUNTER — Ambulatory Visit (INDEPENDENT_AMBULATORY_CARE_PROVIDER_SITE_OTHER): Payer: Medicare Other | Admitting: Neurology

## 2016-11-28 ENCOUNTER — Encounter: Payer: Self-pay | Admitting: Neurology

## 2016-11-28 ENCOUNTER — Telehealth: Payer: Self-pay | Admitting: Neurology

## 2016-11-28 VITALS — BP 135/81 | HR 100 | Ht 64.0 in | Wt 135.0 lb

## 2016-11-28 DIAGNOSIS — M5417 Radiculopathy, lumbosacral region: Secondary | ICD-10-CM

## 2016-11-28 DIAGNOSIS — G20 Parkinson's disease: Secondary | ICD-10-CM

## 2016-11-28 DIAGNOSIS — R269 Unspecified abnormalities of gait and mobility: Secondary | ICD-10-CM

## 2016-11-28 DIAGNOSIS — R270 Ataxia, unspecified: Secondary | ICD-10-CM | POA: Diagnosis not present

## 2016-11-28 DIAGNOSIS — W19XXXA Unspecified fall, initial encounter: Secondary | ICD-10-CM

## 2016-11-28 NOTE — Addendum Note (Signed)
Addended by: Sarina Ill B on: 11/28/2016 08:13 PM   Modules accepted: Orders

## 2016-11-28 NOTE — Patient Instructions (Signed)
Remember to drink plenty of fluid, eat healthy meals and do not skip any meals. Try to eat protein with a every meal and eat a healthy snack such as fruit or nuts in between meals. Try to keep a regular sleep-wake schedule and try to exercise daily, particularly in the form of walking, 20-30 minutes a day, if you can.   As far as diagnostic testing: MRI brain  I would like to see you back after MRI brain, sooner if we need to. Please call us with any interim questions, concerns, problems, updates or refill requests.   Our phone number is [phone]. We also have an after hours call service for urgent matters and there is a physician on-call for urgent questions. For any emergencies you know to call 911 or go to the nearest emergency room

## 2016-11-28 NOTE — Progress Notes (Signed)
[facility]    Provider:  Dr Jaynee Eagles Referring Provider: London Pepper, MD Primary Care Physician:  London Pepper, MD  CC: Falls and abnormal gait  HPI:  Kara Phelps is a 83 y.o. female here as a referral from Dr. Orland Mustard for gait abnormality, Evaluate for parkinson's disease. PMHx bilateral hip replacement, uncontrolled diabetes?(patient reports glucose in the mornings running in the 400s), imbalance. Here with her daughter who provides much information. She had 2 injections into her lumbar spine and did not help. She also had an emg/ncs. She has pain in her leg radiating down the right leg all the way down to the toe. Weakness in the right leg. Started 8 months ago. Worsening. No tremor. No numbness or tingling in the feet. Left leg completely unaffected. She is falling out of bed and can't get up. No dizziness or lightheadedness. No loss of smell or taste. She describes freezing of the right leg. She has a lot of stiffness and a lot of arthritis. She has 2 hip replacements. Memory is good. No hallucinations. Back pain and radiation down the leg. Worse with long periods of sitting or walking. She is in severe pain. No other focal neurologic deficits, associated symptoms, inciting events or modifiable factors.  Reviewed notes, labs and imaging from outside physicians, which showed:  Personally reviewed MRI of the lumbar spine and agree with the following:  In the lower thoracic region, there is chronic disc degeneration with loss of disc height, endplate osteophytes and bulging of the discs. There is narrowing of the ventral subarachnoid space but no compression of the cord. There is facet degeneration and hypertrophy.  L1-2: Mild bulging of the disc. Bilateral facet osteoarthritis. No compressive stenosis.  L2-3: Endplate osteophytes and bulging of the disc. Mild narrowing of the lateral recesses without neural compression.  L3-4: Endplate osteophytes and mild  bulging of the disc. No stenosis or neural compression.  L4-5: Chronic facet arthropathy with 4 mm of anterolisthesis. There is mild stenosis of the lateral recesses and foramina but no visible neural compression. Facet joints at this level could be functionally fused.  L5-S1: Chronic facet arthropathy with 3 mm of anterolisthesis. Endplate osteophytes and bulging of the disc with a focal prominence in the right posterolateral direction. Stenosis of the right lateral recess could focally compress the right S1 nerve root.  IMPRESSION: No evidence of fracture in the region studied.  Chronic degenerative disc disease and degenerative facet disease throughout the region. The only location where it appears there could be focal neural compression is on the right at L5-S1 where endplate osteophytes and bulging disc material result in narrowing of the subarticular lateral recess that could focally affect the right S1 nerve.  Review of Systems: Patient complains of symptoms per HPI as well as the following symptoms: no CP, no SOB. Pertinent negatives per HPI. All others negative.   Social History   Social History  . Marital status: Widowed    Spouse name: N/A  . Number of children: 3  . Years of education: N/A   Occupational History  . N/A    Social History Main Topics  . Smoking status: Never Smoker  . Smokeless tobacco: Never Used  . Alcohol use 8.4 oz/week    14 Standard drinks or equivalent per week     Comment: 2 drinks per day  . Drug use: No  . Sexual activity: Not on file   Other Topics Concern  . Not on file   Social History  Narrative   Lives w/ her daughter   Ambidextrous   Caffeine: 3-4 cups of coffee per day    Family History  Problem Relation Age of Onset  . Other Mother     Old age  . Heart attack Father   . Colon cancer Sister   . Neuropathy Neg Hx     Past Medical History:  Diagnosis Date  . Anxiety   . Depression   . Diabetes ([place])   . High  cholesterol   . Macular degeneration     Past Surgical History:  Procedure Laterality Date  . APPENDECTOMY     as a child  . REPLACEMENT TOTAL KNEE Right   . TOTAL HIP ARTHROPLASTY Bilateral     Current Outpatient Prescriptions  Medication Sig Dispense Refill  . glipiZIDE (GLUCOTROL) 10 MG tablet Take 10 mg by mouth 2 (two) times daily before a meal.     . metFORMIN (GLUCOPHAGE-XR) 500 MG 24 hr tablet Take 500 mg by mouth daily with breakfast.    . Multiple Vitamins-Minerals (MULTIVITAMIN WITH MINERALS) tablet Take 1 tablet by mouth daily.    . sertraline (ZOLOFT) 50 MG tablet Take 50 mg by mouth daily.     No current facility-administered medications for this visit.     Allergies as of 11/28/2016  . (No Known Allergies)    Vitals: BP 135/81   Pulse 100   Ht 5\' 4"  (1.626 m)   Wt 135 lb (61.2 kg)   BMI 23.17 kg/m  Last Weight:  Wt Readings from Last 1 Encounters:  11/28/16 135 lb (61.2 kg)   Last Height:   Ht Readings from Last 1 Encounters:  11/28/16 5\' 4"  (1.626 m)    Physical exam: Exam: Gen: NAD, conversant, well groomed                     CV: RRR, no MRG. No Carotid Bruits. No peripheral edema, warm, nontender Eyes: Conjunctivae clear without exudates or hemorrhage  Neuro: Detailed Neurologic Exam  Speech:    Speech is normal; fluent and spontaneous with normal comprehension.  Cognition:    The patient is oriented to person, place, and time;     recent and remote memory intact;     language fluent;     normal attention, concentration,     fund of knowledge Cranial Nerves:    The pupils are equal, round, and reactive to light. The fundi are normal and spontaneous venous pulsations are present. Visual fields are full to finger confrontation. Extraocular movements are intact. Trigeminal sensation is intact and the muscles of mastication are normal. The face is symmetric. The palate elevates in the midline. Hearing intact. Voice is normal. Shoulder shrug  is normal. The tongue has normal motion without fasciculations.   Coordination:    No dysmetria  Gait:    Wide based, shuffling and waddling, good arm swing  Motor Observation:    no involuntary movements noted. Tone:    Normal muscle tone.    Posture:    Posture is slightly stooped    Strength:      Right leg hip flexion 3/5 (due to pain) Left Hip flexion 4/5 Right biceps femoris 3+/5 Right Dorsiflexion 5-  Sensation: Dec to pin prick in a right L5/S1 distribution. Intact pin prick, vibration distally in the feet, denies any other sensory loss, proprioception slightly impaired on the right great toe but intact left great toe.      Reflex Exam:  DTR's:    Absent right AJ otherwise brisk Toes:    Left upgoing Clonus:    Clonus is absent.    Assessment/Plan:  83 year old female here with right S1 radiculopathy. She reports severe pain in a right S1 distribution, exam shows weakness, sensory changes in an S1 distribution and absent right AJ (s1). MRI clearly shows right S1 impingement. Patient has had 2 ESI without relief. If patient is a good surgical candidate (which should be decided by her surgeon) then I would advise surgical intervention at this point. It is unclear why patient's gait is wide-based, shuffling with freezing and waddling - the wide-based gait is unusual for parkinsonism and no other signs of parkinsonism on exam (no tremor, normal tone, good arm swing, intact facial expressions). I thought possibly her wide-based gait may be due to sensory neuropathy due to her diabetes (running in the 400s in the morning per patient) but sensory exam did not show a decrease in sensation distally in the feet but did show decrease on the right in an S1 distribution.   We will order an MRI of the brain to evaluate for any etiologies of her gait abnormality. Regardless of her gait, which we can further examine with MRI brain and follow clinically and with with other studies, I do  think patient should be considered for surgical intervention of her rigjt S1 radiculopathy as clinically warranted by surgery.  CC: Dr. Orland Mustard and Dr. Radene Gunning, [facility] [address] [place], [place] [address]  Phone [phone] Fax [phone]

## 2016-11-28 NOTE — Telephone Encounter (Signed)
Danielle, can you get an urgent referral out for me please? (not stat just urgent) for this patient? I'd like it to go to Dr. Christella Noa at [facility] thanks

## 2016-11-29 DIAGNOSIS — M5416 Radiculopathy, lumbar region: Secondary | ICD-10-CM | POA: Diagnosis not present

## 2016-11-29 NOTE — Telephone Encounter (Signed)
I called over to Dr. Lacy Duverney office and sent a fax with all of the referral information. I called the number listed on the patients chart and spoke with her daughter (listed on the DPR, not yet scanned but Angie and I found it with the nurse). I informed her the referral had been sent and to call us back if she needed anything. She was very appreciative and said that the service provided by our office and Dr. Jaynee Eagles was above and beyond.

## 2016-12-02 NOTE — Telephone Encounter (Signed)
I spoke to Kara Phelps they can get her in at noon today. Spoke to daughte and she can;t make it then. I advised her to call Rollene Fare at [phone] and schedule an appt. thanks

## 2016-12-04 ENCOUNTER — Other Ambulatory Visit: Payer: Self-pay | Admitting: Orthopedic Surgery

## 2016-12-05 DIAGNOSIS — R2681 Unsteadiness on feet: Secondary | ICD-10-CM | POA: Diagnosis not present

## 2016-12-05 DIAGNOSIS — Z7984 Long term (current) use of oral hypoglycemic drugs: Secondary | ICD-10-CM | POA: Diagnosis not present

## 2016-12-05 DIAGNOSIS — Z9181 History of falling: Secondary | ICD-10-CM | POA: Diagnosis not present

## 2016-12-05 DIAGNOSIS — M5431 Sciatica, right side: Secondary | ICD-10-CM | POA: Diagnosis not present

## 2016-12-05 DIAGNOSIS — E119 Type 2 diabetes mellitus without complications: Secondary | ICD-10-CM | POA: Diagnosis not present

## 2016-12-05 DIAGNOSIS — R2689 Other abnormalities of gait and mobility: Secondary | ICD-10-CM | POA: Diagnosis not present

## 2016-12-07 ENCOUNTER — Ambulatory Visit
Admission: RE | Admit: 2016-12-07 | Discharge: 2016-12-07 | Disposition: A | Discharge status: Home / Self Care | Payer: Medicare Other | Source: Ambulatory Visit | Attending: Neurology | Admitting: Neurology

## 2016-12-07 DIAGNOSIS — M5417 Radiculopathy, lumbosacral region: Secondary | ICD-10-CM

## 2016-12-07 DIAGNOSIS — R269 Unspecified abnormalities of gait and mobility: Secondary | ICD-10-CM

## 2016-12-07 DIAGNOSIS — G20 Parkinson's disease: Secondary | ICD-10-CM

## 2016-12-07 DIAGNOSIS — W19XXXA Unspecified fall, initial encounter: Secondary | ICD-10-CM

## 2016-12-07 DIAGNOSIS — R270 Ataxia, unspecified: Secondary | ICD-10-CM

## 2016-12-07 DIAGNOSIS — R220 Localized swelling, mass and lump, head: Secondary | ICD-10-CM | POA: Diagnosis not present

## 2016-12-09 DIAGNOSIS — Z7984 Long term (current) use of oral hypoglycemic drugs: Secondary | ICD-10-CM | POA: Diagnosis not present

## 2016-12-09 DIAGNOSIS — R2689 Other abnormalities of gait and mobility: Secondary | ICD-10-CM | POA: Diagnosis not present

## 2016-12-09 DIAGNOSIS — E119 Type 2 diabetes mellitus without complications: Secondary | ICD-10-CM | POA: Diagnosis not present

## 2016-12-09 DIAGNOSIS — R2681 Unsteadiness on feet: Secondary | ICD-10-CM | POA: Diagnosis not present

## 2016-12-09 DIAGNOSIS — Z9181 History of falling: Secondary | ICD-10-CM | POA: Diagnosis not present

## 2016-12-09 DIAGNOSIS — M4316 Spondylolisthesis, lumbar region: Secondary | ICD-10-CM | POA: Diagnosis not present

## 2016-12-09 DIAGNOSIS — M5431 Sciatica, right side: Secondary | ICD-10-CM | POA: Diagnosis not present

## 2016-12-12 ENCOUNTER — Telehealth: Payer: Self-pay

## 2016-12-12 NOTE — Telephone Encounter (Signed)
Called w/ MRI results. Discussed w/ daughter as well as w/ patient who verbalized understanding and appreciation for call. Copy of results mailed to pt's verified address as requested. Pt is scheduled for surgery 12/25/16. Daughter said that they are unable to schedule another appt at this time d/t to "so much going on" - she works as a Pharmacist, hospital and pt has multiple other appts. Agreed to call back after recovery to schedule follow-up.

## 2016-12-12 NOTE — Telephone Encounter (Signed)
-----   Message from Melvenia Beam, MD sent at 12/12/2016  8:51 AM EDT ----- There are no strokes or lesions on this MRI. She has some volume loss and white matter changes which can be normal for age. However the ventricles (house fluids) may be a little enlarged(called Normal Pressure Hydrocephalus) and this could be why she is having walking difficulty but my suspicion is low for this, still we should discuss. I would recommend follow up with me so we can discuss in the next month. Is she getting surgery anytime soon for her low back?

## 2016-12-17 DIAGNOSIS — Z9181 History of falling: Secondary | ICD-10-CM | POA: Diagnosis not present

## 2016-12-17 DIAGNOSIS — E119 Type 2 diabetes mellitus without complications: Secondary | ICD-10-CM | POA: Diagnosis not present

## 2016-12-17 DIAGNOSIS — R2681 Unsteadiness on feet: Secondary | ICD-10-CM | POA: Diagnosis not present

## 2016-12-17 DIAGNOSIS — M5431 Sciatica, right side: Secondary | ICD-10-CM | POA: Diagnosis not present

## 2016-12-17 DIAGNOSIS — R2689 Other abnormalities of gait and mobility: Secondary | ICD-10-CM | POA: Diagnosis not present

## 2016-12-17 DIAGNOSIS — Z7984 Long term (current) use of oral hypoglycemic drugs: Secondary | ICD-10-CM | POA: Diagnosis not present

## 2016-12-19 ENCOUNTER — Encounter (HOSPITAL_COMMUNITY): Payer: Self-pay | Admitting: Urology

## 2016-12-19 ENCOUNTER — Ambulatory Visit (HOSPITAL_COMMUNITY)
Admission: RE | Admit: 2016-12-19 | Discharge: 2016-12-19 | Disposition: A | Discharge status: Home / Self Care | Payer: Medicare Other | Source: Ambulatory Visit | Attending: Orthopedic Surgery | Admitting: Orthopedic Surgery

## 2016-12-19 ENCOUNTER — Encounter (HOSPITAL_COMMUNITY)
Admission: RE | Admit: 2016-12-19 | Discharge: 2016-12-19 | Disposition: A | Discharge status: Home / Self Care | Payer: Medicare Other | Source: Ambulatory Visit | Attending: Orthopedic Surgery | Admitting: Orthopedic Surgery

## 2016-12-19 DIAGNOSIS — R918 Other nonspecific abnormal finding of lung field: Secondary | ICD-10-CM | POA: Insufficient documentation

## 2016-12-19 DIAGNOSIS — E119 Type 2 diabetes mellitus without complications: Secondary | ICD-10-CM | POA: Diagnosis not present

## 2016-12-19 DIAGNOSIS — F329 Major depressive disorder, single episode, unspecified: Secondary | ICD-10-CM | POA: Insufficient documentation

## 2016-12-19 DIAGNOSIS — Z01818 Encounter for other preprocedural examination: Secondary | ICD-10-CM | POA: Insufficient documentation

## 2016-12-19 DIAGNOSIS — I517 Cardiomegaly: Secondary | ICD-10-CM | POA: Diagnosis not present

## 2016-12-19 DIAGNOSIS — Z01812 Encounter for preprocedural laboratory examination: Secondary | ICD-10-CM | POA: Insufficient documentation

## 2016-12-19 DIAGNOSIS — F419 Anxiety disorder, unspecified: Secondary | ICD-10-CM | POA: Insufficient documentation

## 2016-12-19 DIAGNOSIS — E7800 Pure hypercholesterolemia, unspecified: Secondary | ICD-10-CM | POA: Diagnosis not present

## 2016-12-19 HISTORY — DX: Malignant (primary) neoplasm, unspecified: C80.1

## 2016-12-19 HISTORY — DX: Unspecified osteoarthritis, unspecified site: M19.90

## 2016-12-19 HISTORY — DX: Personal history of pneumonia (recurrent): Z87.01

## 2016-12-19 LAB — URINALYSIS, ROUTINE W REFLEX MICROSCOPIC
BILIRUBIN URINE: NEGATIVE
Glucose, UA: 50 mg/dL — AB
HGB URINE DIPSTICK: NEGATIVE
KETONES UR: NEGATIVE mg/dL
Nitrite: POSITIVE — AB
Protein, ur: NEGATIVE mg/dL
SPECIFIC GRAVITY, URINE: 1.019 (ref 1.005–1.030)
pH: 5 (ref 5.0–8.0)

## 2016-12-19 LAB — COMPREHENSIVE METABOLIC PANEL
ALT: 16 U/L (ref 14–54)
ANION GAP: 11 (ref 5–15)
AST: 22 U/L (ref 15–41)
Albumin: 4.1 g/dL (ref 3.5–5.0)
Alkaline Phosphatase: 62 U/L (ref 38–126)
BUN: 15 mg/dL (ref 6–20)
CHLORIDE: 101 mmol/L (ref 101–111)
CO2: 24 mmol/L (ref 22–32)
Calcium: 9.6 mg/dL (ref 8.9–10.3)
Creatinine, Ser: 0.47 mg/dL (ref 0.44–1.00)
Glucose, Bld: 129 mg/dL — ABNORMAL HIGH (ref 65–99)
POTASSIUM: 3.9 mmol/L (ref 3.5–5.1)
Sodium: 136 mmol/L (ref 135–145)
Total Bilirubin: 0.6 mg/dL (ref 0.3–1.2)
Total Protein: 7.4 g/dL (ref 6.5–8.1)

## 2016-12-19 LAB — CBC WITH DIFFERENTIAL/PLATELET
Basophils Absolute: 0.1 10*3/uL (ref 0.0–0.1)
Basophils Relative: 1 %
Eosinophils Absolute: 0.2 10*3/uL (ref 0.0–0.7)
Eosinophils Relative: 3 %
HCT: 40.5 % (ref 36.0–46.0)
Hemoglobin: 13.6 g/dL (ref 12.0–15.0)
LYMPHS ABS: 2.1 10*3/uL (ref 0.7–4.0)
Lymphocytes Relative: 27 %
MCH: 32.3 pg (ref 26.0–34.0)
MCHC: 33.6 g/dL (ref 30.0–36.0)
MCV: 96.2 fL (ref 78.0–100.0)
MONO ABS: 0.6 10*3/uL (ref 0.1–1.0)
Monocytes Relative: 7 %
Neutro Abs: 5.1 10*3/uL (ref 1.7–7.7)
Neutrophils Relative %: 62 %
PLATELETS: 259 10*3/uL (ref 150–400)
RBC: 4.21 MIL/uL (ref 3.87–5.11)
RDW: 13.1 % (ref 11.5–15.5)
WBC: 8 10*3/uL (ref 4.0–10.5)

## 2016-12-19 LAB — TYPE AND SCREEN
ABO/RH(D): A POS
ANTIBODY SCREEN: NEGATIVE

## 2016-12-19 LAB — APTT: APTT: 29 s (ref 24–36)

## 2016-12-19 LAB — GLUCOSE, CAPILLARY: Glucose-Capillary: 140 mg/dL — ABNORMAL HIGH (ref 65–99)

## 2016-12-19 LAB — SURGICAL PCR SCREEN
MRSA, PCR: NEGATIVE
STAPHYLOCOCCUS AUREUS: NEGATIVE

## 2016-12-19 LAB — ABO/RH: ABO/RH(D): A POS

## 2016-12-19 LAB — PROTIME-INR
INR: 0.93
PROTHROMBIN TIME: 12.5 s (ref 11.4–15.2)

## 2016-12-19 NOTE — Pre-Procedure Instructions (Addendum)
Kara Phelps  12/19/2016      [facility] [address] AT [facility] [address] [place] [address] Phone: [phone] Fax: [phone]    Your procedure is scheduled on Wednesday April 11.  Report to [facility] Admitting at 12:00 P.M.  Call this number if you have problems the morning of surgery:  [phone]   Remember:  Do not eat food or drink liquids after midnight.  Take these medicines the morning of surgery with A SIP OF WATER: acetaminophen (tylenol) if needed OR oxycodone-acetaminophen (percocet), gabapentin (neurontin), sertraline (zoloft)   Take all other medications as prescribed except 7 days prior to surgery STOP taking any Aspirin, Aleve, Naproxen, Ibuprofen, Motrin, Advil, Goody's, BC's, all herbal medications, fish oil, and all vitamins  WHAT DO I DO ABOUT MY DIABETES MEDICATION?   Marland Kitchen Do not take oral diabetes medicines (pills) the morning of surgery. DO NOT take glipzide (Glucotrol) or metformin (Glucophage) the day of surgery     How to Manage Your Diabetes Before and After Surgery  Why is it important to control my blood sugar before and after surgery? . Improving blood sugar levels before and after surgery helps healing and can limit problems. . A way of improving blood sugar control is eating a healthy diet by: o  Eating less sugar and carbohydrates o  Increasing activity/exercise o  Talking with your doctor about reaching your blood sugar goals . High blood sugars (greater than 180 mg/dL) can raise your risk of infections and slow your recovery, so you will need to focus on controlling your diabetes during the weeks before surgery. . Make sure that the doctor who takes care of your diabetes knows about your planned surgery including the date and location.  How do I manage my blood sugar before surgery? . Check your blood sugar at least 4 times a day, starting 2 days  before surgery, to make sure that the level is not too high or low. o Check your blood sugar the morning of your surgery when you wake up and every 2 hours until you get to the Short Stay unit. . If your blood sugar is less than 70 mg/dL, you will need to treat for low blood sugar: o Do not take insulin. o Treat a low blood sugar (less than 70 mg/dL) with  cup of clear juice (cranberry or apple), 4 glucose tablets, OR glucose gel. o Recheck blood sugar in 15 minutes after treatment (to make sure it is greater than 70 mg/dL). If your blood sugar is not greater than 70 mg/dL on recheck, call [phone] for further instructions. . Report your blood sugar to the short stay nurse when you get to Short Stay.  . If you are admitted to the hospital after surgery: o Your blood sugar will be checked by the staff and you will probably be given insulin after surgery (instead of oral diabetes medicines) to make sure you have good blood sugar levels. o The goal for blood sugar control after surgery is 80-180 mg/dL.                Do not wear jewelry, make-up or nail polish.  Do not wear lotions, powders, or perfumes, or deoderant.  Do not shave 48 hours prior to surgery.  Men may shave face and neck.  Do not bring valuables to the hospital.  [facility] is not responsible  for any belongings or valuables.  Contacts, dentures or bridgework may not be worn into surgery.  Leave your suitcase in the car.  After surgery it may be brought to your room.  For patients admitted to the hospital, discharge time will be determined by your treatment team.  Patients discharged the day of surgery will not be allowed to drive home.    Special instructions:    [facility]- Preparing For Surgery  Before surgery, you can play an important role. Because skin is not sterile, your skin needs to be as free of germs as possible. You can reduce the number of germs on your skin by washing with CHG (chlorahexidine  gluconate) Soap before surgery.  CHG is an antiseptic cleaner which kills germs and bonds with the skin to continue killing germs even after washing.  Please do not use if you have an allergy to CHG or antibacterial soaps. If your skin becomes reddened/irritated stop using the CHG.  Do not shave (including legs and underarms) for at least 48 hours prior to first CHG shower. It is OK to shave your face.  Please follow these instructions carefully.   1. Shower the NIGHT BEFORE SURGERY and the MORNING OF SURGERY with CHG.   2. If you chose to wash your hair, wash your hair first as usual with your normal shampoo.  3. After you shampoo, rinse your hair and body thoroughly to remove the shampoo.  4. Use CHG as you would any other liquid soap. You can apply CHG directly to the skin and wash gently with a scrungie or a clean washcloth.   5. Apply the CHG Soap to your body ONLY FROM THE NECK DOWN.  Do not use on open wounds or open sores. Avoid contact with your eyes, ears, mouth and genitals (private parts). Wash genitals (private parts) with your normal soap.  6. Wash thoroughly, paying special attention to the area where your surgery will be performed.  7. Thoroughly rinse your body with warm water from the neck down.  8. DO NOT shower/wash with your normal soap after using and rinsing off the CHG Soap.  9. Pat yourself dry with a CLEAN TOWEL.   10. Wear CLEAN PAJAMAS   11. Place CLEAN SHEETS on your bed the night of your first shower and DO NOT SLEEP WITH PETS.    Day of Surgery: Do not apply any deodorants/lotions. Please wear clean clothes to the hospital/surgery center.      Please read over the following fact sheets that you were given. MRSA Information

## 2016-12-19 NOTE — Progress Notes (Signed)
PCP: London Pepper No cardiologist, pt states she has lived recently in [place], [place] and had some healthcare in [place]. Pt states she may have had an echo >10 years ago but did not know where. Pt states everything was fine and denies any cardiac history.   Pt is diabetic, fasting CBG 150-200 with occasionally going to 300. Pt does not know last A1c, this lab to be drawn today..   Pt denies chest pain, SOB or signs of infection at PAT appointment.

## 2016-12-19 NOTE — Progress Notes (Signed)
Called Dr Dumonski's office message left on Carla's voice mail to have Dr Lynann Bologna check the Ua done today.

## 2016-12-20 LAB — HEMOGLOBIN A1C
HEMOGLOBIN A1C: 7.3 % — AB (ref 4.8–5.6)
Mean Plasma Glucose: 163 mg/dL

## 2016-12-24 MED ORDER — CEFAZOLIN SODIUM-DEXTROSE 2-4 GM/100ML-% IV SOLN
2.0000 g | INTRAVENOUS | Status: AC
  Administered 2016-12-25: 2 g via INTRAVENOUS
  Filled 2016-12-24: qty 100

## 2016-12-24 NOTE — H&P (Signed)
PREOPERATIVE H&P  Chief Complaint: Right leg pain  HPI: Kara Phelps is a 83 y.o. female who presents with ongoing pain in the right leg  MRI reveals a right-sided L5/S1 disc-osteophyte complex, displacing the right S1 nerve and xrays reveal instability at L5/S1  Patient has failed multiple forms of conservative care and continues to have pain (see office notes for additional details regarding the patient's full course of treatment)  Past Medical History:  Diagnosis Date  . Anxiety   . Arthritis   . Cancer ([place])    skin cancer removed from side of face   . Depression   . Diabetes ([place])   . High cholesterol   . History of pneumonia   . Macular degeneration    injections in left eye   Past Surgical History:  Procedure Laterality Date  . APPENDECTOMY     as a child  . COLONOSCOPY    . REPLACEMENT TOTAL KNEE Right   . TOTAL HIP ARTHROPLASTY Bilateral   . TUBAL LIGATION     Social History   Social History  . Marital status: Widowed    Spouse name: N/A  . Number of children: 3  . Years of education: N/A   Occupational History  . N/A    Social History Main Topics  . Smoking status: Never Smoker  . Smokeless tobacco: Never Used  . Alcohol use 8.4 oz/week    14 Standard drinks or equivalent per week     Comment: 2 drinks per day, vodka  . Drug use: No  . Sexual activity: Not on file   Other Topics Concern  . Not on file   Social History Narrative   Lives w/ her daughter   Ambidextrous   Caffeine: 3-4 cups of coffee per day   Family History  Problem Relation Age of Onset  . Other Mother     Old age  . Heart attack Father   . Colon cancer Sister   . Neuropathy Neg Hx    Allergies  Allergen Reactions  . No Known Allergies    Prior to Admission medications   Medication Sig Start Date End Date Taking? Authorizing Provider  acetaminophen (TYLENOL) 500 MG tablet Take 1,000 mg by mouth every 8 (eight) hours as needed.   Yes Historical Provider, MD   cyanocobalamin 500 MCG tablet Take 500 mcg by mouth daily.   Yes Historical Provider, MD  folic acid (FOLVITE) 166 MCG tablet Take 400 mcg by mouth daily.   Yes Historical Provider, MD  gabapentin (NEURONTIN) 300 MG capsule Take 300 mg by mouth 3 (three) times daily.   Yes Historical Provider, MD  glipiZIDE (GLUCOTROL XL) 2.5 MG 24 hr tablet Take 1 tablet by mouth daily. 11/06/16  Yes Historical Provider, MD  metFORMIN (GLUCOPHAGE-XR) 500 MG 24 hr tablet Take 500 mg by mouth daily with breakfast.   Yes Historical Provider, MD  Milk Thistle 175 MG CAPS Take 1 capsule by mouth daily.   Yes Historical Provider, MD  Misc Natural Products (FOCUSED MIND PO) Take 1 tablet by mouth daily.   Yes Historical Provider, MD  Multiple Vitamins-Minerals (PRESERVISION AREDS PO) Take 1 tablet by mouth daily.   Yes Historical Provider, MD  oxyCODONE-acetaminophen (PERCOCET/ROXICET) 5-325 MG tablet Take 1 tablet by mouth every 6 (six) hours as needed for severe pain.   Yes Historical Provider, MD  sertraline (ZOLOFT) 50 MG tablet Take 50 mg by mouth daily.   Yes Historical Provider, MD  All other systems have been reviewed and were otherwise negative with the exception of those mentioned in the HPI and as above.  Physical Exam: There were no vitals filed for this visit.  General: Alert, no acute distress Cardiovascular: No pedal edema Respiratory: No cyanosis, no use of accessory musculature Skin: No lesions in the area of chief complaint Neurologic: Sensation intact distally Psychiatric: Patient is competent for consent with normal mood and affect Lymphatic: No axillary or cervical lymphadenopathy  MUSCULOSKELETAL: + SLR on the right  Assessment/Plan: Right leg pain Plan for Procedure(s): LUMBAR 5-SACRUM 1 DECOMPRESSION AND FUSION WITH INSTRUMENTATION AND ALLOGRAFT   Sinclair Ship, MD 12/24/2016 4:31 PM

## 2016-12-25 ENCOUNTER — Inpatient Hospital Stay (HOSPITAL_COMMUNITY): Payer: Medicare Other | Admitting: Anesthesiology

## 2016-12-25 ENCOUNTER — Inpatient Hospital Stay (HOSPITAL_COMMUNITY): Payer: Medicare Other

## 2016-12-25 ENCOUNTER — Inpatient Hospital Stay (HOSPITAL_COMMUNITY)
Admission: RE | Disposition: A | Discharge status: Skilled Nursing Facility | Payer: Self-pay | Source: Ambulatory Visit | Attending: Orthopedic Surgery

## 2016-12-25 ENCOUNTER — Encounter (HOSPITAL_COMMUNITY): Payer: Self-pay | Admitting: *Deleted

## 2016-12-25 ENCOUNTER — Inpatient Hospital Stay (HOSPITAL_COMMUNITY)
Admission: RE | Admit: 2016-12-25 | Discharge: 2016-12-30 | DRG: 460 | Disposition: A | Discharge status: Skilled Nursing Facility | Payer: Medicare Other | Source: Ambulatory Visit | Attending: Orthopedic Surgery | Admitting: Orthopedic Surgery

## 2016-12-25 DIAGNOSIS — E7800 Pure hypercholesterolemia, unspecified: Secondary | ICD-10-CM | POA: Diagnosis present

## 2016-12-25 DIAGNOSIS — Y92230 Patient room in hospital as the place of occurrence of the external cause: Secondary | ICD-10-CM | POA: Diagnosis not present

## 2016-12-25 DIAGNOSIS — Z7984 Long term (current) use of oral hypoglycemic drugs: Secondary | ICD-10-CM

## 2016-12-25 DIAGNOSIS — M5117 Intervertebral disc disorders with radiculopathy, lumbosacral region: Secondary | ICD-10-CM | POA: Diagnosis present

## 2016-12-25 DIAGNOSIS — M79604 Pain in right leg: Secondary | ICD-10-CM | POA: Diagnosis not present

## 2016-12-25 DIAGNOSIS — M4317 Spondylolisthesis, lumbosacral region: Secondary | ICD-10-CM | POA: Diagnosis not present

## 2016-12-25 DIAGNOSIS — E119 Type 2 diabetes mellitus without complications: Secondary | ICD-10-CM | POA: Diagnosis present

## 2016-12-25 DIAGNOSIS — M5417 Radiculopathy, lumbosacral region: Secondary | ICD-10-CM

## 2016-12-25 DIAGNOSIS — Z419 Encounter for procedure for purposes other than remedying health state, unspecified: Secondary | ICD-10-CM

## 2016-12-25 DIAGNOSIS — T24011A Burn of unspecified degree of right thigh, initial encounter: Secondary | ICD-10-CM | POA: Diagnosis not present

## 2016-12-25 DIAGNOSIS — F329 Major depressive disorder, single episode, unspecified: Secondary | ICD-10-CM | POA: Diagnosis present

## 2016-12-25 DIAGNOSIS — Z4782 Encounter for orthopedic aftercare following scoliosis surgery: Secondary | ICD-10-CM | POA: Diagnosis not present

## 2016-12-25 DIAGNOSIS — M810 Age-related osteoporosis without current pathological fracture: Secondary | ICD-10-CM | POA: Diagnosis present

## 2016-12-25 DIAGNOSIS — M5416 Radiculopathy, lumbar region: Secondary | ICD-10-CM | POA: Diagnosis not present

## 2016-12-25 DIAGNOSIS — Z96643 Presence of artificial hip joint, bilateral: Secondary | ICD-10-CM | POA: Diagnosis present

## 2016-12-25 DIAGNOSIS — Z96651 Presence of right artificial knee joint: Secondary | ICD-10-CM | POA: Diagnosis present

## 2016-12-25 DIAGNOSIS — X100XXA Contact with hot drinks, initial encounter: Secondary | ICD-10-CM | POA: Diagnosis not present

## 2016-12-25 DIAGNOSIS — G709 Myoneural disorder, unspecified: Secondary | ICD-10-CM | POA: Diagnosis not present

## 2016-12-25 DIAGNOSIS — M5410 Radiculopathy, site unspecified: Secondary | ICD-10-CM | POA: Diagnosis not present

## 2016-12-25 DIAGNOSIS — Z85828 Personal history of other malignant neoplasm of skin: Secondary | ICD-10-CM | POA: Diagnosis not present

## 2016-12-25 DIAGNOSIS — Z79899 Other long term (current) drug therapy: Secondary | ICD-10-CM | POA: Diagnosis not present

## 2016-12-25 DIAGNOSIS — M1990 Unspecified osteoarthritis, unspecified site: Secondary | ICD-10-CM | POA: Diagnosis not present

## 2016-12-25 DIAGNOSIS — H3530 Unspecified macular degeneration: Secondary | ICD-10-CM | POA: Diagnosis not present

## 2016-12-25 DIAGNOSIS — M4326 Fusion of spine, lumbar region: Secondary | ICD-10-CM | POA: Diagnosis not present

## 2016-12-25 DIAGNOSIS — M5137 Other intervertebral disc degeneration, lumbosacral region: Secondary | ICD-10-CM | POA: Diagnosis not present

## 2016-12-25 DIAGNOSIS — F418 Other specified anxiety disorders: Secondary | ICD-10-CM | POA: Diagnosis not present

## 2016-12-25 HISTORY — DX: Myoneural disorder, unspecified: G70.9

## 2016-12-25 HISTORY — DX: Pneumonia, unspecified organism: J18.9

## 2016-12-25 LAB — GLUCOSE, CAPILLARY
GLUCOSE-CAPILLARY: 99 mg/dL (ref 65–99)
GLUCOSE-CAPILLARY: 163 mg/dL — AB (ref 65–99)
Glucose-Capillary: 188 mg/dL — ABNORMAL HIGH (ref 65–99)

## 2016-12-25 SURGERY — POSTERIOR LUMBAR FUSION 1 LEVEL
Anesthesia: General | Site: Back

## 2016-12-25 MED ORDER — HYDROMORPHONE HCL 1 MG/ML IJ SOLN
0.2500 mg | INTRAMUSCULAR | Status: DC | PRN
  Administered 2016-12-25: 0.5 mg via INTRAVENOUS
  Administered 2016-12-25 (×2): 0.25 mg via INTRAVENOUS

## 2016-12-25 MED ORDER — SUGAMMADEX SODIUM 200 MG/2ML IV SOLN
INTRAVENOUS | Status: DC | PRN
  Administered 2016-12-25: 120 mg via INTRAVENOUS

## 2016-12-25 MED ORDER — DOCUSATE SODIUM 100 MG PO CAPS
100.0000 mg | ORAL_CAPSULE | Freq: Two times a day (BID) | ORAL | Status: DC
  Administered 2016-12-25 – 2016-12-29 (×8): 100 mg via ORAL
  Filled 2016-12-25 (×9): qty 1

## 2016-12-25 MED ORDER — SODIUM CHLORIDE 0.9 % IV SOLN
250.0000 mL | INTRAVENOUS | Status: DC
  Administered 2016-12-25: 250 mL via INTRAVENOUS

## 2016-12-25 MED ORDER — METHYLENE BLUE 0.5 % INJ SOLN
INTRAVENOUS | Status: AC
  Filled 2016-12-25: qty 10

## 2016-12-25 MED ORDER — PROMETHAZINE HCL 25 MG/ML IJ SOLN: 6.2500 mg | INTRAMUSCULAR | Status: DC | PRN

## 2016-12-25 MED ORDER — GLIPIZIDE ER 2.5 MG PO TB24
2.5000 mg | ORAL_TABLET | Freq: Every day | ORAL | Status: DC
  Administered 2016-12-26 – 2016-12-30 (×5): 2.5 mg via ORAL
  Filled 2016-12-25 (×5): qty 1

## 2016-12-25 MED ORDER — POVIDONE-IODINE 7.5 % EX SOLN
Freq: Once | CUTANEOUS | Status: DC
  Filled 2016-12-25: qty 118

## 2016-12-25 MED ORDER — PHENYLEPHRINE HCL 10 MG/ML IJ SOLN
INTRAVENOUS | Status: DC | PRN
  Administered 2016-12-25: 30 ug/min via INTRAVENOUS

## 2016-12-25 MED ORDER — ZOLPIDEM TARTRATE 5 MG PO TABS
5.0000 mg | ORAL_TABLET | Freq: Every evening | ORAL | Status: DC | PRN
  Administered 2016-12-29: 5 mg via ORAL
  Filled 2016-12-25: qty 1

## 2016-12-25 MED ORDER — OXYCODONE-ACETAMINOPHEN 5-325 MG PO TABS
1.0000 | ORAL_TABLET | ORAL | Status: DC | PRN
  Administered 2016-12-25: 1 via ORAL
  Administered 2016-12-26 – 2016-12-28 (×5): 2 via ORAL
  Filled 2016-12-25 (×3): qty 2
  Filled 2016-12-25: qty 1
  Filled 2016-12-25 (×2): qty 2

## 2016-12-25 MED ORDER — BISACODYL 5 MG PO TBEC
5.0000 mg | DELAYED_RELEASE_TABLET | Freq: Every day | ORAL | Status: DC | PRN
Start: 2016-12-25 — End: 2016-12-30
  Administered 2016-12-28: 5 mg via ORAL
  Filled 2016-12-25: qty 1

## 2016-12-25 MED ORDER — PROPOFOL 10 MG/ML IV BOLUS
INTRAVENOUS | Status: AC
  Filled 2016-12-25: qty 20

## 2016-12-25 MED ORDER — BUPIVACAINE-EPINEPHRINE 0.25% -1:200000 IJ SOLN
INTRAMUSCULAR | Status: DC | PRN
  Administered 2016-12-25: 7 mL
  Administered 2016-12-25: 20 mL

## 2016-12-25 MED ORDER — SODIUM CHLORIDE 0.9% FLUSH
3.0000 mL | Freq: Two times a day (BID) | INTRAVENOUS | Status: DC
Start: 2016-12-25 — End: 2016-12-30
  Administered 2016-12-26 – 2016-12-30 (×7): 3 mL via INTRAVENOUS

## 2016-12-25 MED ORDER — FOLIC ACID 1 MG PO TABS
0.5000 mg | ORAL_TABLET | Freq: Every day | ORAL | Status: DC
  Administered 2016-12-25 – 2016-12-30 (×6): 0.5 mg via ORAL
  Filled 2016-12-25 (×6): qty 1

## 2016-12-25 MED ORDER — BUPIVACAINE LIPOSOME 1.3 % IJ SUSP
20.0000 mL | INTRAMUSCULAR | Status: AC
  Administered 2016-12-25: 20 mL
  Filled 2016-12-25: qty 20

## 2016-12-25 MED ORDER — PROPOFOL 10 MG/ML IV BOLUS
INTRAVENOUS | Status: DC | PRN
  Administered 2016-12-25: 150 mg via INTRAVENOUS

## 2016-12-25 MED ORDER — FENTANYL CITRATE (PF) 250 MCG/5ML IJ SOLN
INTRAMUSCULAR | Status: DC | PRN
  Administered 2016-12-25: 25 ug via INTRAVENOUS
  Administered 2016-12-25: 50 ug via INTRAVENOUS
  Administered 2016-12-25: 75 ug via INTRAVENOUS

## 2016-12-25 MED ORDER — ACETAMINOPHEN 325 MG PO TABS
650.0000 mg | ORAL_TABLET | ORAL | Status: DC | PRN
Start: 2016-12-25 — End: 2016-12-30
  Administered 2016-12-25 – 2016-12-27 (×3): 650 mg via ORAL
  Filled 2016-12-25 (×3): qty 2

## 2016-12-25 MED ORDER — ONDANSETRON HCL 4 MG/2ML IJ SOLN
INTRAMUSCULAR | Status: DC | PRN
  Administered 2016-12-25: 4 mg via INTRAVENOUS

## 2016-12-25 MED ORDER — SERTRALINE HCL 50 MG PO TABS
50.0000 mg | ORAL_TABLET | Freq: Every day | ORAL | Status: DC
Start: 2016-12-26 — End: 2016-12-30
  Administered 2016-12-26 – 2016-12-30 (×5): 50 mg via ORAL
  Filled 2016-12-25 (×5): qty 1

## 2016-12-25 MED ORDER — HYDROMORPHONE HCL 1 MG/ML IJ SOLN
INTRAMUSCULAR | Status: AC
  Filled 2016-12-25: qty 0.5

## 2016-12-25 MED ORDER — PHENOL 1.4 % MT LIQD: 1.0000 | OROMUCOSAL | Status: DC | PRN

## 2016-12-25 MED ORDER — PANTOPRAZOLE SODIUM 40 MG IV SOLR
40.0000 mg | Freq: Every day | INTRAVENOUS | Status: DC
  Administered 2016-12-25: 40 mg via INTRAVENOUS
  Filled 2016-12-25: qty 40

## 2016-12-25 MED ORDER — THROMBIN 20000 UNITS EX SOLR
CUTANEOUS | Status: AC
  Filled 2016-12-25: qty 20000

## 2016-12-25 MED ORDER — ALUM & MAG HYDROXIDE-SIMETH 200-200-20 MG/5ML PO SUSP: 30.0000 mL | Freq: Four times a day (QID) | ORAL | Status: DC | PRN

## 2016-12-25 MED ORDER — LACTATED RINGERS IV SOLN
INTRAVENOUS | Status: DC
  Administered 2016-12-25 (×3): via INTRAVENOUS

## 2016-12-25 MED ORDER — SUGAMMADEX SODIUM 200 MG/2ML IV SOLN
INTRAVENOUS | Status: AC
  Filled 2016-12-25: qty 2

## 2016-12-25 MED ORDER — ONDANSETRON HCL 4 MG/2ML IJ SOLN
INTRAMUSCULAR | Status: AC
  Filled 2016-12-25: qty 2

## 2016-12-25 MED ORDER — POTASSIUM CHLORIDE IN NACL 20-0.9 MEQ/L-% IV SOLN
INTRAVENOUS | Status: DC
  Administered 2016-12-25: 21:00:00 via INTRAVENOUS
  Filled 2016-12-25: qty 1000

## 2016-12-25 MED ORDER — LACTATED RINGERS IV SOLN: INTRAVENOUS | Status: DC | PRN

## 2016-12-25 MED ORDER — 0.9 % SODIUM CHLORIDE (POUR BTL) OPTIME
TOPICAL | Status: DC | PRN
  Administered 2016-12-25: 1000 mL

## 2016-12-25 MED ORDER — PHENYLEPHRINE 40 MCG/ML (10ML) SYRINGE FOR IV PUSH (FOR BLOOD PRESSURE SUPPORT)
PREFILLED_SYRINGE | INTRAVENOUS | Status: AC
  Filled 2016-12-25: qty 10

## 2016-12-25 MED ORDER — OCUVITE-LUTEIN PO CAPS
1.0000 | ORAL_CAPSULE | Freq: Every day | ORAL | Status: DC
  Filled 2016-12-25: qty 1

## 2016-12-25 MED ORDER — HEMOSTATIC AGENTS (NO CHARGE) OPTIME
TOPICAL | Status: DC | PRN
  Administered 2016-12-25: 1 via TOPICAL

## 2016-12-25 MED ORDER — MORPHINE SULFATE (PF) 2 MG/ML IV SOLN
1.0000 mg | INTRAVENOUS | Status: DC | PRN
  Administered 2016-12-25 – 2016-12-26 (×3): 2 mg via INTRAVENOUS
  Administered 2016-12-27: 1 mg via INTRAVENOUS
  Filled 2016-12-25 (×4): qty 1

## 2016-12-25 MED ORDER — PRESERVISION AREDS PO CAPS: ORAL_CAPSULE | Freq: Every day | ORAL | Status: DC

## 2016-12-25 MED ORDER — SENNOSIDES-DOCUSATE SODIUM 8.6-50 MG PO TABS
1.0000 | ORAL_TABLET | Freq: Every evening | ORAL | Status: DC | PRN
  Administered 2016-12-28: 1 via ORAL
  Filled 2016-12-25: qty 1

## 2016-12-25 MED ORDER — MENTHOL 3 MG MT LOZG
1.0000 | LOZENGE | OROMUCOSAL | Status: DC | PRN
Start: 2016-12-25 — End: 2016-12-30

## 2016-12-25 MED ORDER — ROCURONIUM BROMIDE 100 MG/10ML IV SOLN
INTRAVENOUS | Status: DC | PRN
  Administered 2016-12-25: 40 mg via INTRAVENOUS
  Administered 2016-12-25: 10 mg via INTRAVENOUS

## 2016-12-25 MED ORDER — DIAZEPAM 5 MG PO TABS
5.0000 mg | ORAL_TABLET | Freq: Four times a day (QID) | ORAL | Status: DC | PRN
  Filled 2016-12-25: qty 1

## 2016-12-25 MED ORDER — ACETAMINOPHEN 650 MG RE SUPP: 650.0000 mg | RECTAL | Status: DC | PRN

## 2016-12-25 MED ORDER — LIDOCAINE HCL (CARDIAC) 20 MG/ML IV SOLN
INTRAVENOUS | Status: DC | PRN
  Administered 2016-12-25: 80 mg via INTRAVENOUS

## 2016-12-25 MED ORDER — ONDANSETRON HCL 4 MG/2ML IJ SOLN: 4.0000 mg | Freq: Four times a day (QID) | INTRAMUSCULAR | Status: DC | PRN

## 2016-12-25 MED ORDER — SODIUM CHLORIDE 0.9 % IV SOLN
INTRAVENOUS | Status: DC | PRN
  Administered 2016-12-25: .02 mg via INTRAVENOUS

## 2016-12-25 MED ORDER — FLEET ENEMA 7-19 GM/118ML RE ENEM: 1.0000 | ENEMA | Freq: Once | RECTAL | Status: DC | PRN

## 2016-12-25 MED ORDER — DEXTROSE 5 % IV SOLN: INTRAVENOUS | Status: DC | PRN

## 2016-12-25 MED ORDER — FOLIC ACID 400 MCG PO TABS: 400.0000 ug | ORAL_TABLET | Freq: Every day | ORAL | Status: DC

## 2016-12-25 MED ORDER — GABAPENTIN 300 MG PO CAPS
300.0000 mg | ORAL_CAPSULE | Freq: Three times a day (TID) | ORAL | Status: DC
  Administered 2016-12-25 – 2016-12-30 (×14): 300 mg via ORAL
  Filled 2016-12-25 (×14): qty 1

## 2016-12-25 MED ORDER — CEFAZOLIN SODIUM-DEXTROSE 2-4 GM/100ML-% IV SOLN
2.0000 g | Freq: Three times a day (TID) | INTRAVENOUS | Status: AC
  Administered 2016-12-25 – 2016-12-26 (×2): 2 g via INTRAVENOUS
  Filled 2016-12-25 (×2): qty 100

## 2016-12-25 MED ORDER — FENTANYL CITRATE (PF) 250 MCG/5ML IJ SOLN
INTRAMUSCULAR | Status: AC
  Filled 2016-12-25: qty 5

## 2016-12-25 MED ORDER — EPINEPHRINE PF 1 MG/ML IJ SOLN
INTRAMUSCULAR | Status: AC
  Filled 2016-12-25: qty 1

## 2016-12-25 MED ORDER — PROPOFOL 500 MG/50ML IV EMUL
INTRAVENOUS | Status: DC | PRN
  Administered 2016-12-25: 50 ug/kg/min via INTRAVENOUS

## 2016-12-25 MED ORDER — THROMBIN 20000 UNITS EX KIT
PACK | CUTANEOUS | Status: DC | PRN
  Administered 2016-12-25: 20000 [IU] via TOPICAL

## 2016-12-25 MED ORDER — VITAMIN B-12 100 MCG PO TABS
500.0000 ug | ORAL_TABLET | Freq: Every day | ORAL | Status: DC
  Administered 2016-12-26 – 2016-12-30 (×5): 500 ug via ORAL
  Filled 2016-12-25 (×5): qty 5

## 2016-12-25 MED ORDER — PHENYLEPHRINE HCL 10 MG/ML IJ SOLN
INTRAMUSCULAR | Status: DC | PRN
Start: 2016-12-25 — End: 2016-12-25
  Administered 2016-12-25 (×2): 200 ug via INTRAVENOUS

## 2016-12-25 MED ORDER — ONDANSETRON HCL 4 MG PO TABS: 4.0000 mg | ORAL_TABLET | Freq: Four times a day (QID) | ORAL | Status: DC | PRN

## 2016-12-25 MED ORDER — METFORMIN HCL ER 500 MG PO TB24
500.0000 mg | ORAL_TABLET | Freq: Every day | ORAL | Status: DC
  Administered 2016-12-26 – 2016-12-30 (×5): 500 mg via ORAL
  Filled 2016-12-25 (×5): qty 1

## 2016-12-25 MED ORDER — SODIUM CHLORIDE 0.9% FLUSH: 3.0000 mL | INTRAVENOUS | Status: DC | PRN

## 2016-12-25 MED ORDER — MINERAL OIL LIGHT 100 % EX OIL
TOPICAL_OIL | CUTANEOUS | Status: AC
  Filled 2016-12-25: qty 25

## 2016-12-25 SURGICAL SUPPLY — 79 items
BENZOIN TINCTURE PRP APPL 2/3 (GAUZE/BANDAGES/DRESSINGS) ×3 IMPLANT
BLADE CLIPPER SURG (BLADE) IMPLANT
BONE VIVIGEN FORMABLE 5.4CC (Bone Implant) ×3 IMPLANT
BUR PRESCISION 1.7 ELITE (BURR) ×3 IMPLANT
BUR ROUND PRECISION 4.0 (BURR) ×2 IMPLANT
BUR ROUND PRECISION 4.0MM (BURR) ×1
CARTRIDGE OIL MAESTRO DRILL (MISCELLANEOUS) ×1 IMPLANT
CLOSURE WOUND 1/2 X4 (GAUZE/BANDAGES/DRESSINGS) ×1
CONT SPEC STER OR (MISCELLANEOUS) ×3 IMPLANT
COVER MAYO STAND STRL (DRAPES) ×3 IMPLANT
COVER SURGICAL LIGHT HANDLE (MISCELLANEOUS) ×3 IMPLANT
DIFFUSER DRILL AIR PNEUMATIC (MISCELLANEOUS) ×3 IMPLANT
DRAIN CHANNEL 15F RND FF W/TCR (WOUND CARE) IMPLANT
DRAPE C-ARM 42X72 X-RAY (DRAPES) ×3 IMPLANT
DRAPE C-ARMOR (DRAPES) ×3 IMPLANT
DRAPE POUCH INSTRU U-SHP 10X18 (DRAPES) ×3 IMPLANT
DRAPE SURG 17X23 STRL (DRAPES) ×12 IMPLANT
DURAPREP 26ML APPLICATOR (WOUND CARE) ×3 IMPLANT
ELECT BLADE 4.0 EZ CLEAN MEGAD (MISCELLANEOUS) ×3
ELECT CAUTERY BLADE 6.4 (BLADE) ×3 IMPLANT
ELECT REM PT RETURN 9FT ADLT (ELECTROSURGICAL) ×3
ELECTRODE BLDE 4.0 EZ CLN MEGD (MISCELLANEOUS) ×1 IMPLANT
ELECTRODE REM PT RTRN 9FT ADLT (ELECTROSURGICAL) ×1 IMPLANT
EVACUATOR SILICONE 100CC (DRAIN) IMPLANT
FEE INTRAOP MONITOR IMPULS NCS (MISCELLANEOUS) ×1 IMPLANT
GAUZE SPONGE 4X4 12PLY STRL (GAUZE/BANDAGES/DRESSINGS) ×3 IMPLANT
GAUZE SPONGE 4X4 16PLY XRAY LF (GAUZE/BANDAGES/DRESSINGS) ×3 IMPLANT
GLOVE BIO SURGEON STRL SZ7 (GLOVE) ×3 IMPLANT
GLOVE BIO SURGEON STRL SZ8 (GLOVE) ×3 IMPLANT
GLOVE BIOGEL PI IND STRL 7.0 (GLOVE) ×1 IMPLANT
GLOVE BIOGEL PI IND STRL 8 (GLOVE) ×1 IMPLANT
GLOVE BIOGEL PI INDICATOR 7.0 (GLOVE) ×2
GLOVE BIOGEL PI INDICATOR 8 (GLOVE) ×2
GLOVE INDICATOR 7.0 STRL GRN (GLOVE) ×3 IMPLANT
GLOVE SURG SS PI 7.0 STRL IVOR (GLOVE) ×6 IMPLANT
GOWN STRL REUS W/ TWL LRG LVL3 (GOWN DISPOSABLE) ×3 IMPLANT
GOWN STRL REUS W/ TWL XL LVL3 (GOWN DISPOSABLE) ×2 IMPLANT
GOWN STRL REUS W/TWL LRG LVL3 (GOWN DISPOSABLE) ×6
GOWN STRL REUS W/TWL XL LVL3 (GOWN DISPOSABLE) ×4
INTRAOP MONITOR FEE IMPULS NCS (MISCELLANEOUS) ×1
INTRAOP MONITOR FEE IMPULSE (MISCELLANEOUS) ×2
IV CATH 14GX2 1/4 (CATHETERS) ×3 IMPLANT
KIT BASIN OR (CUSTOM PROCEDURE TRAY) ×3 IMPLANT
KIT POSITION SURG JACKSON T1 (MISCELLANEOUS) ×3 IMPLANT
KIT ROOM TURNOVER OR (KITS) ×3 IMPLANT
MARKER SKIN DUAL TIP RULER LAB (MISCELLANEOUS) ×3 IMPLANT
NDL SAFETY ECLIPSE 18X1.5 (NEEDLE) ×1 IMPLANT
NEEDLE 22X1 1/2 (OR ONLY) (NEEDLE) ×3 IMPLANT
NEEDLE HYPO 18GX1.5 SHARP (NEEDLE) ×2
NEEDLE HYPO 25GX1X1/2 BEV (NEEDLE) ×3 IMPLANT
NEEDLE SPNL 18GX3.5 QUINCKE PK (NEEDLE) ×6 IMPLANT
NS IRRIG 1000ML POUR BTL (IV SOLUTION) ×3 IMPLANT
OIL CARTRIDGE MAESTRO DRILL (MISCELLANEOUS) ×3
PACK LAMINECTOMY ORTHO (CUSTOM PROCEDURE TRAY) ×3 IMPLANT
PACK UNIVERSAL I (CUSTOM PROCEDURE TRAY) ×3 IMPLANT
PAD ARMBOARD 7.5X6 YLW CONV (MISCELLANEOUS) ×6 IMPLANT
PATTIES SURGICAL .5 X1 (DISPOSABLE) ×3 IMPLANT
PATTIES SURGICAL .5X1.5 (GAUZE/BANDAGES/DRESSINGS) ×3 IMPLANT
PROBE PEDCLE PROBE MAGSTM DISP (MISCELLANEOUS) ×6 IMPLANT
SPONGE INTESTINAL PEANUT (DISPOSABLE) ×3 IMPLANT
SPONGE SURGIFOAM ABS GEL 100 (HEMOSTASIS) ×3 IMPLANT
STRIP CLOSURE SKIN 1/2X4 (GAUZE/BANDAGES/DRESSINGS) ×2 IMPLANT
SURGIFLO TRUKIT (HEMOSTASIS) ×3 IMPLANT
SURGIFLO W/THROMBIN 8M KIT (HEMOSTASIS) IMPLANT
SUT MNCRL AB 4-0 PS2 18 (SUTURE) ×3 IMPLANT
SUT VIC AB 0 CT1 18XCR BRD 8 (SUTURE) ×1 IMPLANT
SUT VIC AB 0 CT1 8-18 (SUTURE) ×2
SUT VIC AB 1 CT1 18XCR BRD 8 (SUTURE) ×1 IMPLANT
SUT VIC AB 1 CT1 8-18 (SUTURE) ×2
SUT VIC AB 2-0 CT2 18 VCP726D (SUTURE) ×3 IMPLANT
SYR 20CC LL (SYRINGE) ×3 IMPLANT
SYR BULB IRRIGATION 50ML (SYRINGE) ×3 IMPLANT
SYR CONTROL 10ML LL (SYRINGE) ×6 IMPLANT
SYR TB 1ML LUER SLIP (SYRINGE) ×3 IMPLANT
TOWEL OR 17X24 6PK STRL BLUE (TOWEL DISPOSABLE) ×3 IMPLANT
TOWEL OR 17X26 10 PK STRL BLUE (TOWEL DISPOSABLE) ×3 IMPLANT
TRAY FOLEY W/METER SILVER 16FR (SET/KITS/TRAYS/PACK) ×3 IMPLANT
WATER STERILE IRR 1000ML POUR (IV SOLUTION) ×3 IMPLANT
YANKAUER SUCT BULB TIP NO VENT (SUCTIONS) ×3 IMPLANT

## 2016-12-25 NOTE — Transfer of Care (Signed)
Immediate Anesthesia Transfer of Care Note  Patient: Kara Phelps  Procedure(s) Performed: Procedure(s) with comments: LUMBAR 5-SACRUM 1 DECOMPRESSION WITH  INSITU FUSION WITH  ALLOGRAFT (N/A) - LUMBAR 5-SACRUM 1 DECOMPRESSION AND FUSION WITH INSTRUMENTATION AND ALLOGRAFT  Patient Location: PACU  Anesthesia Type:General  Level of Consciousness: drowsy, patient cooperative and responds to stimulation  Airway & Oxygen Therapy: Patient Spontanous Breathing and Patient connected to nasal cannula oxygen  Post-op Assessment: Report given to RN, Patient moving all extremities and Patient moving all extremities X 4  Post vital signs: Reviewed and stable  Last Vitals:  Vitals:   12/25/16 1227  BP: 129/79  Pulse: 89  Resp: 18  Temp: 37.1 C    Last Pain:  Vitals:   12/25/16 1227  TempSrc: Oral  PainSc:       Patients Stated Pain Goal: 3 (32/76/14 7092)  Complications: No apparent anesthesia complications

## 2016-12-25 NOTE — Anesthesia Preprocedure Evaluation (Signed)
Anesthesia Evaluation  Patient identified by MRN, date of birth, ID band Patient awake    Reviewed: Allergy & Precautions, NPO status , Patient's Chart, lab work & pertinent test results  Airway Mallampati: II  TM Distance: >3 FB Neck ROM: Full    Dental  (+) Teeth Intact   Pulmonary neg pulmonary ROS,    breath sounds clear to auscultation       Cardiovascular  Rhythm:Regular Rate:Normal     Neuro/Psych negative neurological ROS     GI/Hepatic negative GI ROS, Neg liver ROS,   Endo/Other  negative endocrine ROSdiabetes  Renal/GU negative Renal ROS     Musculoskeletal  (+) Arthritis ,   Abdominal   Peds  Hematology negative hematology ROS (+)   Anesthesia Other Findings   Reproductive/Obstetrics                             Anesthesia Physical Anesthesia Plan  ASA: II  Anesthesia Plan: General   Post-op Pain Management:    Induction: Intravenous  Airway Management Planned: Oral ETT  Additional Equipment:   Intra-op Plan:   Post-operative Plan: Extubation in OR  Informed Consent: I have reviewed the patients History and Physical, chart, labs and discussed the procedure including the risks, benefits and alternatives for the proposed anesthesia with the patient or authorized representative who has indicated his/her understanding and acceptance.   Dental advisory given  Plan Discussed with: CRNA  Anesthesia Plan Comments:         Anesthesia Quick Evaluation

## 2016-12-25 NOTE — Progress Notes (Signed)
Patient called to inform nurse that blood sugar was 182.  Patient does not take insulin at home.  Informed patient that we will check her blood sugar when she arrives to short stay.

## 2016-12-25 NOTE — Anesthesia Procedure Notes (Signed)
Procedure Name: Intubation Date/Time: 12/25/2016 2:33 PM Performed by: Jacquiline Doe A Pre-anesthesia Checklist: Patient identified, Emergency Drugs available, Suction available and Patient being monitored Patient Re-evaluated:Patient Re-evaluated prior to inductionOxygen Delivery Method: Circle System Utilized and Circle system utilized Preoxygenation: Pre-oxygenation with 100% oxygen Intubation Type: IV induction and Cricoid Pressure applied Ventilation: Mask ventilation without difficulty Laryngoscope Size: Mac and 3 Grade View: Grade I Tube type: Oral Number of attempts: 1 Airway Equipment and Method: Stylet Placement Confirmation: ETT inserted through vocal cords under direct vision,  positive ETCO2 and breath sounds checked- equal and bilateral Secured at: 22 cm Tube secured with: Tape Dental Injury: Teeth and Oropharynx as per pre-operative assessment

## 2016-12-25 NOTE — Progress Notes (Signed)
Rehab Choice:  [facility] [place]. [place], [place] [address] [phone]  Prefers semiprivate room, if none available will take private until a semi-private opens up.

## 2016-12-25 NOTE — Progress Notes (Signed)
Patient arrived from PACU, assessment completed and frequent vs documented.  Snack provided.

## 2016-12-26 ENCOUNTER — Encounter (HOSPITAL_COMMUNITY): Payer: Self-pay

## 2016-12-26 MED ORDER — PANTOPRAZOLE SODIUM 40 MG PO TBEC
40.0000 mg | DELAYED_RELEASE_TABLET | Freq: Every day | ORAL | Status: DC
  Administered 2016-12-26 – 2016-12-29 (×4): 40 mg via ORAL
  Filled 2016-12-26 (×4): qty 1

## 2016-12-26 MED ORDER — PROSIGHT PO TABS
1.0000 | ORAL_TABLET | Freq: Every day | ORAL | Status: DC
  Administered 2016-12-26 – 2016-12-30 (×5): 1 via ORAL
  Filled 2016-12-26 (×5): qty 1

## 2016-12-26 MED FILL — Thrombin For Soln 20000 Unit: CUTANEOUS | Qty: 1 | Status: AC

## 2016-12-26 MED FILL — Heparin Sodium (Porcine) Inj 1000 Unit/ML: INTRAMUSCULAR | Qty: 30 | Status: AC

## 2016-12-26 MED FILL — Sodium Chloride IV Soln 0.9%: INTRAVENOUS | Qty: 1000 | Status: AC

## 2016-12-26 NOTE — Anesthesia Postprocedure Evaluation (Signed)
Anesthesia Post Note  Patient: Kara Phelps  Procedure(s) Performed: Procedure(s) (LRB): LUMBAR 5-SACRUM 1 DECOMPRESSION WITH  INSITU FUSION WITH  ALLOGRAFT (N/A)  Patient location during evaluation: PACU Anesthesia Type: General Level of consciousness: awake and alert and patient cooperative Pain management: pain level controlled Vital Signs Assessment: post-procedure vital signs reviewed and stable Respiratory status: spontaneous breathing and respiratory function stable Cardiovascular status: stable Anesthetic complications: no       Last Vitals:  Vitals:   12/26/16 1452 12/26/16 1825  BP: 114/65 122/65  Pulse: 87 98  Resp: 18 18  Temp: 37.9 C (!) 38.9 C    Last Pain:  Vitals:   12/26/16 1825  TempSrc: Oral  PainSc:                  [place] S

## 2016-12-26 NOTE — Progress Notes (Signed)
[facility]  Patient Status: Active (receiving services up to time of hospitalization)  [facility] is providing the following services: PT  If patient discharges after hours, please call [phone].   Edwinna Areola 12/26/2016, 2:52 PM

## 2016-12-26 NOTE — Evaluation (Addendum)
Physical Therapy Evaluation Patient Details Name: Kara Phelps MRN: [id number] DOB: 08-Dec-1933 Today's Date: 12/26/2016   History of Present Illness  Pt is 83 y/o female s/p L5-S1 decompression and fusion. 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 Pt also has hx of multiple falls.   Clinical Impression  Patient is s/p above surgery resulting in the deficits listed below (see PT Problem List). PTA, pt with multiple falls, however, and reports she was using RW and had difficulty walking. Per chart, pt lives alone. Upon evaluation, pt presenting with decreased balance, strength, and difficulty sequencing during transfers. Max A to stand pivot transfer to the chair as pt demonstrating posterior lean. Recommending SNF at d/c to increase independence and safety with mobility at d/c. Patient will benefit from skilled PT to increase their independence and safety with mobility (while adhering to their precautions) to allow discharge to the venue listed below. Will continue to follow.      Follow Up Recommendations SNF;Supervision/Assistance - 24 hour    Equipment Recommendations  None recommended by PT    Recommendations for Other Services       Precautions / Restrictions Precautions Precautions: Back;Fall Precaution Booklet Issued: Yes (comment) Precaution Comments: Reviewed precautions with pt.  Required Braces or Orthoses: Spinal Brace Spinal Brace: Thoracolumbosacral orthotic;Applied in sitting position Restrictions Weight Bearing Restrictions: No      Mobility  Bed Mobility Overal bed mobility: Needs Assistance Bed Mobility: Rolling;Sidelying to Sit Rolling: Mod assist Sidelying to sit: Mod assist;HOB elevated       General bed mobility comments: Mod A for rolling and trunk elevation. Multiple cues for sequencing during log roll transfer.   Transfers Overall transfer level: Needs assistance Equipment used: Rolling walker (2  wheeled) Transfers: Sit to/from [facility] Sit to Stand: Mod assist;From elevated surface Stand pivot transfers: Max assist       General transfer comment: Pt demonstrating posterior lean during standing. Verbal cues for upright posture and appropriate UE placement prior to transfer. Stand pivot transfer X 2 to [facility] then to recliner. Pt requiring max A for transfer and demonstrated difficulty seqencing with RLE. Heavy posterior lean during transfer. Pt with fear of falling throughout transfer and required multiple cues that PT was there to assist.   Ambulation/Gait                Stairs            Wheelchair Mobility    Modified Rankin (Stroke Patients Only)       Balance Overall balance assessment: Needs assistance Sitting-balance support: Bilateral upper extremity supported;Feet supported Sitting balance-Leahy Scale: Poor Sitting balance - Comments: R lateral lean in sitting. Multiple cues and min A for upright posture.  Postural control: Right lateral lean;Posterior lean Standing balance support: Bilateral upper extremity supported Standing balance-Leahy Scale: Poor Standing balance comment: Heavy posterior lean in standing with mod to max A to maintain standing balance.                              Pertinent Vitals/Pain Pain Assessment: Faces Faces Pain Scale: Hurts whole lot Pain Location: back  Pain Descriptors / Indicators: Operative site guarding;Sore Pain Intervention(s): Limited activity within patient's tolerance;Monitored during session;Repositioned    Home Living Family/patient expects to be discharged to:: Skilled nursing facility  Prior Function Level of Independence: Independent with assistive device(s)         Comments: Used a walker at baseline. Pt has history of falls. Reports "big fall" down the steps about a year ago. Reports she is not very good at walking.      Hand Dominance    Dominant Hand:  (ambidextrous)    Extremity/Trunk Assessment   Upper Extremity Assessment Upper Extremity Assessment: LUE deficits/detail LUE Deficits / Details: LUE numbness; reports it is new    Lower Extremity Assessment Lower Extremity Assessment: Generalized weakness (Grossly 3+/5 throughout )    Cervical / Trunk Assessment Cervical / Trunk Assessment: Other exceptions Cervical / Trunk Exceptions: s/p surgery   Communication   Communication: No difficulties  Cognition Arousal/Alertness: Awake/alert Behavior During Therapy: WFL for tasks assessed/performed Overall Cognitive Status: No family/caregiver present to determine baseline cognitive functioning                                 General Comments: Pt with slowed processing and difficulty with problem solving during session. No caregiver present to determine baseline functioning.       General Comments General comments (skin integrity, edema, etc.): Pt with difficulty sequencing with RW and LE sequencing (RLE>LLE). Pt with no caregiver present so unable to determine baseline functioning.     Exercises     Assessment/Plan    PT Assessment Patient needs continued PT services  PT Problem List Decreased strength;Decreased activity tolerance;Decreased balance;Decreased mobility;Decreased coordination;Decreased cognition;Decreased knowledge of use of DME;Decreased safety awareness;Decreased knowledge of precautions;Impaired sensation;Pain       PT Treatment Interventions DME instruction;Gait training;Functional mobility training;Therapeutic activities;Therapeutic exercise;Balance training;Neuromuscular re-education;Cognitive remediation;Patient/family education    PT Goals (Current goals can be found in the Care Plan section)  Acute Rehab PT Goals Patient Stated Goal: to go to rehab to get stronger PT Goal Formulation: With patient Time For Goal Achievement: 01/09/17 Potential to Achieve Goals: Fair     Frequency Min 5X/week   Barriers to discharge Decreased caregiver support Pt lives alone    Co-evaluation               End of Session Equipment Utilized During Treatment: Gait belt;Back brace Activity Tolerance: Patient limited by pain Patient left: in chair;with call bell/phone within reach;with chair alarm set Nurse Communication: Mobility status PT Visit Diagnosis: Unsteadiness on feet (R26.81);Repeated falls (R29.6);History of falling (Z91.81);Pain Pain - part of body:  (back )    Time: 8099-8338 PT Time Calculation (min) (ACUTE ONLY): 32 min   Charges:   PT Evaluation $PT Eval Moderate Complexity: 1 Procedure PT Treatments $Therapeutic Activity: 8-22 mins   PT G Codes:        Nicky Pugh, PT, DPT  Acute Rehabilitation Services  Pager: [phone]   Army Melia 12/26/2016, 10:41 AM

## 2016-12-26 NOTE — Progress Notes (Signed)
Occupational Therapy Treatment Patient Details Name: Kara Phelps MRN: [id number] DOB: 05-07-1934 Today's Date: 12/26/2016    History of present illness Pt is 83 y/o female s/p L5-S1 decompression and fusion. 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 Pt also has hx of multiple falls.    OT comments  Pt brighter this pm.   She was able to move to EOB with mod A stood and pivoted with mod A for weight shifts.  She was incontinent of urine and was assisted with peri care.   Clarified with pt and daugther - Pt IS married (not widowed, and spouse lives in [place]).   Will continue to follow.   Follow Up Recommendations  SNF    Equipment Recommendations  None recommended by OT    Recommendations for Other Services      Precautions / Restrictions Precautions Precautions: Back;Fall Precaution Comments: pt able to recall 2/3 back precautions  Required Braces or Orthoses: Spinal Brace Spinal Brace: Thoracolumbosacral orthotic;Applied in sitting position       Mobility Bed Mobility Overal bed mobility: Needs Assistance Bed Mobility: Rolling;Sidelying to Sit;Sit to Sidelying Rolling: Mod assist Sidelying to sit: Mod assist     Sit to sidelying: Max assist General bed mobility comments: step by step cues for sequencing and technique  Transfers Overall transfer level: Needs assistance Equipment used: Rolling walker (2 wheeled) Transfers: Stand Pivot Transfers;Sit to/from Stand Sit to Stand: Mod assist;From elevated surface Stand pivot transfers: Mod assist       General transfer comment: Pt requires assist for weight shifts     Balance Overall balance assessment: Needs assistance Sitting-balance support: Bilateral upper extremity supported;Feet supported Sitting balance-Leahy Scale: Poor Sitting balance - Comments: R lateral lean in sitting. Multiple cues and min A for upright posture.    Standing balance support: Bilateral upper  extremity supported Standing balance-Leahy Scale: Poor Standing balance comment: Pt requires mod A and bil. UE support                            ADL either performed or assessed with clinical judgement   ADL                           Toilet Transfer: Moderate assistance;Stand-pivot;BSC;RW   Toileting- Clothing Manipulation and Hygiene: Total assistance;Sit to/from stand         General ADL Comments: Pt incontinent of urine and assisted with peri care      Vision       Perception     Praxis      Cognition Arousal/Alertness: Awake/alert Behavior During Therapy: WFL for tasks assessed/performed Overall Cognitive Status: No family/caregiver present to determine baseline cognitive functioning                                 General Comments: cognition appears WFL.  Pt reports husband called her from [place] earlier.  Spoke with RN who phoned daughter, who reports pt is NOT widowed, and her spouse does live in [place]         Exercises     Shoulder Instructions       General Comments      Pertinent Vitals/ Pain       Pain Assessment: 0-10 Pain Score: 8  Pain Location: back  Pain Descriptors / Indicators: Operative site  guarding;Grimacing;Guarding;Sharp;Radiating Pain Intervention(s): Monitored during session;Patient requesting pain meds-RN notified  Home Living                                          Prior Functioning/Environment              Frequency  Min 2X/week        Progress Toward Goals  OT Goals(current goals can now be found in the care plan section)        Plan Discharge plan remains appropriate    Co-evaluation                 End of Session Equipment Utilized During Treatment: Rolling walker;Gait belt;Back brace  OT Visit Diagnosis: Pain   Activity Tolerance Patient tolerated treatment well   Patient Left in bed;with call bell/phone within reach;with nursing/sitter in room    Nurse Communication Mobility status;Patient requests pain meds        Time: 7341-9379 OT Time Calculation (min): 42 min  Charges: OT General Charges $OT Visit: 1 Procedure OT Treatments $Therapeutic Activity: 38-52 mins  [facility], OTR/L [phone]    Lucille Passy M 12/26/2016, 5:16 PM

## 2016-12-26 NOTE — Progress Notes (Signed)
    Patient doing well Minimal LBP Patient deniesR leg pain  Physical Exam: Vitals:   12/26/16 0100 12/26/16 0501  BP: (!) 96/58 104/63  Pulse: 79 82  Resp: 16 18  Temp:  98.9 F (37.2 C)    Dressing in place NVI  POD #1 s/p L5/S1 decompression and fusion,doing well  Patient is requesting going to a SNF at [facility] in [place], which I feel is appropriate  - up with PT/OT, encourage ambulation - Percocet for pain, Valium for muscle spasms - to SNF when bed available

## 2016-12-26 NOTE — Evaluation (Signed)
Occupational Therapy Evaluation Patient Details Name: Kara Phelps MRN: [id number] DOB: April 25, 1934 Today's Date: 12/26/2016    History of Present Illness Pt is 83 y/o female s/p L5-S1 decompression and fusion. 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 Pt also has hx of multiple falls.    Clinical Impression   Pt admitted with above. He demonstrates the below listed deficits and will benefit from continued OT to maximize safety and independence with BADLs.  Eval limited by pain.  She requires mod - total A for ADLs.  She is tearful during eval and voices that she is unsure "why they keep [place] around like this"... "it's miserable".   She also reports her spouse lives in [place], but in chart it is indicated that she is widowed - Therapist, sports notified.        Follow Up Recommendations  SNF    Equipment Recommendations  None recommended by OT    Recommendations for Other Services       Precautions / Restrictions Precautions Precautions: Back;Fall Precaution Booklet Issued: Yes (comment) Precaution Comments: pt able to recall 2/3 back precautions  Required Braces or Orthoses: Spinal Brace Spinal Brace: Thoracolumbosacral orthotic;Applied in sitting position Restrictions Weight Bearing Restrictions: No      Mobility Bed Mobility Overal bed mobility: Needs Assistance Bed Mobility: Rolling;Sidelying to Sit Rolling: Mod assist Sidelying to sit: Mod assist;HOB elevated       General bed mobility comments: max A to scoot up in bed.  Pt would not attempt EOB or OOB due to pain   Transfers Overall transfer level: Needs assistance Equipment used: Rolling walker (2 wheeled) Transfers: Sit to/from [facility] Sit to Stand: Mod assist;From elevated surface Stand pivot transfers: Max assist       General transfer comment: would not attempt due to pain     Balance Overall balance assessment: Needs assistance Sitting-balance support:  Bilateral upper extremity supported;Feet supported Sitting balance-Leahy Scale: Poor Sitting balance - Comments: R lateral lean in sitting. Multiple cues and min A for upright posture.  Postural control: Right lateral lean;Posterior lean Standing balance support: Bilateral upper extremity supported Standing balance-Leahy Scale: Poor Standing balance comment: Heavy posterior lean in standing with mod to max A to maintain standing balance.                            ADL either performed or assessed with clinical judgement   ADL Overall ADL's : Needs assistance/impaired Eating/Feeding: Set up;Bed level Eating/Feeding Details (indicate cue type and reason): Pt with difficulty locating all items and opening container due to low vision  Grooming: Wash/dry hands;Wash/dry face;Oral care;Brushing hair;Set up;Bed level   Upper Body Bathing: Minimal assistance;Bed level   Lower Body Bathing: Maximal assistance;Bed level   Upper Body Dressing : Maximal assistance;Bed level   Lower Body Dressing: Bed level;Total assistance   Toilet Transfer: Total assistance Toilet Transfer Details (indicate cue type and reason): unable  Toileting- Clothing Manipulation and Hygiene: Total assistance;Bed level       Functional mobility during ADLs: Total assistance       Vision Baseline Vision/History: Wears glasses;Macular Degeneration Wears Glasses: At all times Patient Visual Report: No change from baseline Additional Comments: Pt reports low vision - she can see outline of objects.  Adjusted lighting in room to reduce glare and improve visual performance       Perception     Praxis  Pertinent Vitals/Pain Pain Assessment: 0-10 Pain Score: 5  Faces Pain Scale: Hurts whole lot Pain Location: back  Pain Descriptors / Indicators: Operative site guarding;Grimacing;Guarding;Sharp;Radiating Pain Intervention(s): Limited activity within patient's tolerance;Repositioned     Hand  Dominance  (ambidextrous)   Extremity/Trunk Assessment Upper Extremity Assessment Upper Extremity Assessment: Generalized weakness LUE Deficits / Details: LUE numbness; reports it is new   Lower Extremity Assessment Lower Extremity Assessment: Defer to PT evaluation   Cervical / Trunk Assessment Cervical / Trunk Assessment: Other exceptions Cervical / Trunk Exceptions: s/p surgery    Communication Communication Communication: No difficulties   Cognition Arousal/Alertness: Awake/alert Behavior During Therapy: Anxious (tearful ) Overall Cognitive Status: No family/caregiver present to determine baseline cognitive functioning                                 General Comments: Pt initially very irritable, and then tearful.  She states "I don't understand why they keep [place] alive", "this is miserable", "why do they do this to [place]".  Encouragement provided.  Pt also reported that her spouse currently lives in [place], and they are still married, however, neurology note in chart indicates pt is widowed.  RN notified of all.    General Comments  Pt with difficulty sequencing with RW and LE sequencing (RLE>LLE). Pt with no caregiver present so unable to determine baseline functioning.     Exercises     Shoulder Instructions      Home Living Family/patient expects to be discharged to:: Skilled nursing facility Living Arrangements: Alone                                      Prior Functioning/Environment Level of Independence: Independent with assistive device(s)        Comments: Used a walker at baseline. Pt has history of falls. Reports "big fall" down the steps about a year ago. Reports she is not very good at walking.         OT Problem List: Decreased strength;Decreased activity tolerance;Impaired balance (sitting and/or standing);Decreased cognition;Decreased safety awareness;Decreased knowledge of use of DME or AE;Decreased knowledge of  precautions;Pain;Impaired vision/perception      OT Treatment/Interventions: Self-care/ADL training;DME and/or AE instruction;Therapeutic activities;Cognitive remediation/compensation;Patient/family education;Balance training;Visual/perceptual remediation/compensation    OT Goals(Current goals can be found in the care plan section) Acute Rehab OT Goals Patient Stated Goal: to go to rehab to get stronger OT Goal Formulation: With patient Time For Goal Achievement: 01/02/17 Potential to Achieve Goals: Good ADL Goals Pt Will Perform Grooming: with mod assist;standing Pt Will Perform Upper Body Bathing: with supervision;with set-up;sitting Pt Will Perform Lower Body Bathing: with mod assist;with adaptive equipment;sit to/from stand Pt Will Perform Upper Body Dressing: with supervision;with set-up;sitting Pt Will Perform Lower Body Dressing: with mod assist;with adaptive equipment;sit to/from stand Pt Will Transfer to Toilet: with mod assist;ambulating;regular height toilet;bedside commode;grab bars Pt Will Perform Toileting - Clothing Manipulation and hygiene: with mod assist;sit to/from stand  OT Frequency: Min 2X/week   Barriers to D/C: Decreased caregiver support          Co-evaluation              End of Session Nurse Communication: Other (comment) (confusion and behavioral changes )  Activity Tolerance: Patient limited by pain Patient left: in bed;with call bell/phone within reach  OT Visit Diagnosis: Pain  Time: 8251-8984 OT Time Calculation (min): 28 min Charges:  OT General Charges $OT Visit: 1 Procedure OT Evaluation $OT Eval Moderate Complexity: 1 Procedure OT Treatments $Self Care/Home Management : 8-22 mins G-Codes:     Lucille Passy, OTR/L [phone]   Lucille Passy M 12/26/2016, 12:53 PM

## 2016-12-26 NOTE — Care Management Note (Signed)
Case Management Note  Patient Details  Name: Kara Phelps MRN: [id number] Date of Birth: 1934-04-10  Subjective/Objective:   Pt underwent:  Complex L5-S1 decompression with bilateral partial facetectomy and     complex removal of right-sided L5-S1 herniated disk fragment, very     much adherent to the ventral surface of the traversing right S1 nerve. L5-S1 posterolateral fusion.    She is from home alone.            Action/Plan: PT/OT recommendations are for SNF. CSW aware. CM following.  Expected Discharge Date:  12/28/16               Expected Discharge Plan:  Skilled Nursing Facility  In-House Referral:  Clinical Social Work  Discharge planning Services     Post Acute Care Choice:    Choice offered to:     DME Arranged:    DME Agency:     HH Arranged:    HH Agency:     Status of Service:  In process, will continue to follow  If discussed at Long Length of Stay Meetings, dates discussed:    Additional Comments:  Pollie Friar, RN 12/26/2016, 2:26 PM

## 2016-12-26 NOTE — Op Note (Signed)
NAMEJAKERA, Kara Phelps                  ACCOUNT NO.:  MEDICAL RECORD NO.:  [id number]  LOCATION:                                 FACILITY:  PHYSICIAN:  Phylliss Bob, MD           DATE OF BIRTH:  DATE OF PROCEDURE:  12/25/2016                              OPERATIVE REPORT   PREOPERATIVE DIAGNOSES: 1. Right-sided S1 radiculopathy. 2. Grade 1 L5-S1 spondylolisthesis. 3. Severe L5-S1 degenerative disc disease.  POSTOPERATIVE DIAGNOSES: 1. Right-sided S1 radiculopathy. 2. Grade 1 L5-S1 spondylolisthesis. 3. Severe L5-S1 degenerative disc disease.  PROCEDURE: 1. Complex L5-S1 decompression with bilateral partial facetectomy and     complex removal of right-sided L5-S1 herniated disk fragment, very     much adherent to the ventral surface of the traversing right S1     nerve. 2. L5-S1 posterolateral fusion. 3. Use of local autograft. 4. Use of morselized allograft.  SURGEON:  Phylliss Bob, MD  ASSISTANT:  Pricilla Holm PA-C.  ANESTHESIA:  General endotracheal anesthesia.  COMPLICATIONS:  None.  DISPOSITION:  Stable.  ESTIMATED BLOOD LOSS:  150 mL.  INDICATIONS FOR SURGERY:  Briefly, Kara Phelps is an 83 year old female who did present to me on October 18, 2016 after moving from [place].  She states that she was due to have surgery on her back, given her substantial right leg pain.  I did obtain an updated MRI, which did reveal an ill-defined disc protrusion on the right side at L5-S1 with minimal displacement of the right S1 nerve.  The nerve block targeting the right S1 nerve did result in 100% relief of her leg pain, which was temporary.  Given her ongoing pain, we did discuss proceeding with the procedure reflected above.  The patient was fully aware of the risks and limitations and did elect to proceed.  OPERATIVE DETAILS:  On December 25, 2016, the patient was brought to surgery and general endotracheal anesthesia was administered.  The patient was placed prone on a  well-padded flat Jackson bed with a spinal frame.  Antibiotics were given.  The back was prepped and draped and a time-out was performed.  A midline incision was made.  The fascia was incised at the midline.  The paraspinal musculature was bluntly swept laterally on the right and left sides.  There was very significant and abundant facet hypertrophy noted bilaterally at the L5-S1 level.  This was removed using a high-speed bur.  I did attempt to cannulate the L5 and S1 pedicles, and it was readily obvious that the patient's bone was very soft and the intervertebral space did not have significant motion associated with it, so I did make the decision to proceed with an in situ fusion using allograft and autograft only, and not placing instrumentation.  I did feel that her osteoporosis would increase the risk of her instrumentation to fail.  I then decorticated the posterolateral gutter on the left side.  I then proceeded with a thorough and complete central and bilateral lateral recess decompression at L5-S1.  I then turned my attention toward the right side and the traversing right S1 nerve was identified and attempted doubly to be medially  retracted.  However, it was readily obvious that the nerve was very much adherent to a chronic appearing disc fragment immediately ventral to it.  I did meticulously tease away the adhesion.  This did allow me to gain medial retraction of the traversing right S1 nerve, after which point, I did use reverse angled Epstein curettes to remove multiple chronic broad-based fragments and the right side at the L5-S1 intervertebral space.  There were additional very small fragments that continue to be adherent to the nerve.  I did feel that any additional attempts at removing these fragments would bring with injury to the nerve, and therefore I did not attempt to remove them, however, I was very happy with the decompression.  The wound was then  copiously irrigated.  All bleeding was controlled using bipolar electrocautery in addition to Surgiflo.  On the left side, allograft in the form of division as well as autograft from the decompression was packed into the posterolateral gutter and along the posterior elements on the left side. The wound was then closed in layers using #1 Vicryl followed by 2-0 Vicryl followed by 4-0 Monocryl.  Benzoin and Steri-Strips were applied followed by sterile dressing.  All instrument counts were correct at the termination of the procedure.  Of note, Pricilla Holm was my assistant throughout surgery, and did aid in retraction, suctioning, and closure from start to finish.    Phylliss Bob, MD     MD/MEDQ  D:  12/25/2016  T:  12/25/2016  Job:  [id number]

## 2016-12-27 LAB — GLUCOSE, CAPILLARY
GLUCOSE-CAPILLARY: 243 mg/dL — AB (ref 65–99)
GLUCOSE-CAPILLARY: 166 mg/dL — AB (ref 65–99)
Glucose-Capillary: 219 mg/dL — ABNORMAL HIGH (ref 65–99)
Glucose-Capillary: 208 mg/dL — ABNORMAL HIGH (ref 65–99)

## 2016-12-27 NOTE — Progress Notes (Signed)
Inpatient Diabetes Program Recommendations  AACE/ADA: New Consensus Statement on Inpatient Glycemic Control (2015)  Target Ranges:  Prepandial:   less than 140 mg/dL      Peak postprandial:   less than 180 mg/dL (1-2 hours)      Critically ill patients:  140 - 180 mg/dL   Lab Results  Component Value Date   GLUCAP 219 (H) 12/27/2016   HGBA1C 7.3 (H) 12/19/2016    Review of Glycemic Control  Diabetes history: DM2  Outpatient Diabetes medications: Glipizide 2.5 mg daily, Metformin 500 mg daily  Current orders for Inpatient glycemic control: Glipizide 2.5 mg daily, Metformin 500 mg daily   Inpatient Diabetes Program Recommendations:   HgbA1C: Noted A1C= 7.3% and blood glucose of 219, please consider adding POCT of CBG's TID AC & HS while in patient.  Diet: Noted DM diagnosis.  Please change diet to CARB MOD diet.  Thank you,  Windy Carina, RN, MSN Diabetes Coordinator Inpatient Diabetes Program [phone] (Team Pager)

## 2016-12-27 NOTE — Progress Notes (Signed)
Patient had 101.1 oral temp at 0522 gave her 650 mg of tylenol at 0534.

## 2016-12-27 NOTE — Clinical Social Work Note (Signed)
Clinical Social Work Assessment  Patient Details  Name: Kara Phelps MRN: [id number] Date of Birth: 03/10/1934  Date of referral:  12/27/16               Reason for consult:  Facility Placement, Discharge Planning                Permission sought to share information with:  Facility Sport and exercise psychologist, Family Supports Permission granted to share information::  Yes, Verbal Permission Granted  Name::     Shamika Pedregon  Agency::  SNF's  Relationship::  Daughter  Contact Information:  [phone]  Housing/Transportation Living arrangements for the past 2 months:  [place] of Information:  Patient, Medical Team Patient Interpreter Needed:  None Criminal Activity/Legal Involvement Pertinent to Current Situation/Hospitalization:  No - Comment as needed Significant Relationships:  Adult Children Lives with:    Do you feel safe going back to the place where you live?  Yes Need for family participation in patient care:  Yes (Comment)  Care giving concerns:  PT recommending SNF once medically stable for discharge.   Social Worker assessment / plan:  CSW met with patient. No supports at bedside. CSW introduced role and explained that PT recommendations would be discussed. Patient agreeable to SNF. First preference is [facility]. She does not have another preference. CSW provided SNF list for her to review. Patient states she will take a semiprivate or private room. No further concerns. CSW encouraged patient to contact CSW as needed. CSW will continue to follow patient for support and facilitate discharge to SNF once medically stable.  Employment status:  Retired Forensic scientist:  Medicare PT Recommendations:  [place] / Referral to community resources:  [place]  Patient/Family's Response to care:  Patient agreeable to SNF placement. Patient's daughter supportive and involved in patient's care. Patient appreciated  social work intervention.  Patient/Family's Understanding of and Emotional Response to Diagnosis, Current Treatment, and Prognosis:  Patient has a good understanding of the reason for admission and PT recommendations. Patient appears happy with hospital care.  Emotional Assessment Appearance:  Appears stated age Attitude/Demeanor/Rapport:  Other (Pleasant) Affect (typically observed):  Accepting, Appropriate, Calm, Pleasant Orientation:  Oriented to Self, Oriented to Place, Oriented to  Time, Oriented to Situation Alcohol / Substance use:  Never Used Psych involvement (Current and /or in the community):  No (Comment)  Discharge Needs  Concerns to be addressed:  Care Coordination Readmission within the last 30 days:  No Current discharge risk:  Dependent with Mobility Barriers to Discharge:  Continued Medical Work up, Programmer, applications ([place])   Candie Chroman, LCSW 12/27/2016, 4:44 PM

## 2016-12-27 NOTE — Progress Notes (Addendum)
Physical Therapy Treatment Patient Details Name: Kara Phelps MRN: [id number] DOB: Jan 21, 1934 Today's Date: 12/27/2016    History of Present Illness Pt is 83 y/o female s/p L5-S1 decompression and fusion. 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 Pt also has hx of multiple falls.     PT Comments    Pt performed increased activity and progressed  To gait training in the hall.  Pt motivated and pleased with her progress.  Pt resting comfortably in bed post tx.  Plan for SNF for short term rehab remains appropriate.     Follow Up Recommendations  SNF;Supervision/Assistance - 24 hour     Equipment Recommendations  None recommended by PT    Recommendations for Other Services       Precautions / Restrictions Precautions Precautions: Back;Fall Precaution Booklet Issued: Yes (comment) Precaution Comments: pt able to recall 2/3 back precautions.  Required cueing for do not lift.   Required Braces or Orthoses: Spinal Brace Spinal Brace: Thoracolumbosacral orthotic;Applied in sitting position Restrictions Weight Bearing Restrictions: No    Mobility  Bed Mobility Overal bed mobility: Needs Assistance Bed Mobility: Rolling;Sit to Sidelying Rolling: Mod assist       Sit to sidelying: Mod assist General bed mobility comments: Cues for sequencing and hand placement to achieve placement in bed.    Transfers Overall transfer level: Needs assistance Equipment used: Rolling walker (2 wheeled) Transfers: Stand Pivot Transfers;Sit to/from Stand Sit to Stand: Mod assist Stand pivot transfers: Mod assist       General transfer comment: Cues for hand placement to push from seated surface.  Pt required assistance to boost into standing.    Ambulation/Gait Ambulation/Gait assistance: Mod assist Ambulation Distance (Feet): 28 Feet Assistive device: Rolling walker (2 wheeled) Gait Pattern/deviations: Step-through pattern;Wide base of  support;Decreased stride length;Step-to pattern;Shuffle     General Gait Details: Cues for B foot clearance and increasing B stride length.  Pt performed short gait trial with shuffling pattern.  Required 2-3 standing breaks.     Stairs            Wheelchair Mobility    Modified Rankin (Stroke Patients Only)       Balance Overall balance assessment: Needs assistance Sitting-balance support: Bilateral upper extremity supported;Feet supported Sitting balance-Leahy Scale: Poor       Standing balance-Leahy Scale: Poor                              Cognition Arousal/Alertness: Awake/alert Behavior During Therapy: WFL for tasks assessed/performed Overall Cognitive Status: Within Functional Limits for tasks assessed                                        Exercises      General Comments        Pertinent Vitals/Pain Pain Assessment: 0-10 Pain Score: 8  Pain Location: back  Pain Descriptors / Indicators: Operative site guarding;Grimacing;Guarding;Sharp;Radiating Pain Intervention(s): Monitored during session;Repositioned    Home Living                      Prior Function            PT Goals (current goals can now be found in the care plan section) Acute Rehab PT Goals Patient Stated Goal: to go to rehab to get stronger PT  Goal Formulation: With patient Potential to Achieve Goals: Fair Progress towards PT goals: Progressing toward goals    Frequency    Min 5X/week      PT Plan Current plan remains appropriate    Co-evaluation             End of Session Equipment Utilized During Treatment: Gait belt;Back brace Activity Tolerance: Patient limited by pain Patient left: in chair;with call bell/phone within reach;with chair alarm set Nurse Communication: Mobility status PT Visit Diagnosis: Unsteadiness on feet (R26.81);Repeated falls (R29.6);History of falling (Z91.81);Pain Pain - part of body:  (back)      Time: 6122-4497 PT Time Calculation (min) (ACUTE ONLY): 27 min  Charges:  $Gait Training: 8-22 mins $Therapeutic Activity: 8-22 mins                    G Codes:       Governor Rooks, PTA pager [phone]    Cristela Blue 12/27/2016, 4:23 PM

## 2016-12-27 NOTE — Progress Notes (Signed)
Patients daughter Jodi Mourning phone  # [phone] came by hospital she thought that the Social worker would have tried to contact her regarding the placement of her Mother. She indicated that she did not hear from the social worker but did know that her mother social security # was needed to complete the FL2.  SS# is [phone], if the Social worker will contact [place] at the phone number listed above she will be glad to assist with any other detail.

## 2016-12-27 NOTE — Progress Notes (Signed)
Attempted to ambulate patient, she does not actively participate in the process, she has poor balance and leans to the side and to the back making ambulation unsafe at this time.

## 2016-12-27 NOTE — Progress Notes (Signed)
    Patient doing well Has been mobilizing very slowly Denies R or L leg pain Patient expressed some concerns regarding her nursing care   Physical Exam: Vitals:   12/27/16 0522 12/27/16 0623  BP: 136/66   Pulse: 92   Resp: 18   Temp: 100.1 F (37.8 C) 100.1 F (37.8 C)    Dressing in place NVI  POD #1 s/p L5-S1 decompression and fusion, doing well with poor and slow mobilization.   - up with PT/OT, encourage ambulation - Percocet for pain, Valium for muscle spasms - slightly elevated temp - encourage IS - d/c to SNF when bed becomes available - will discuss patient's concerns with hospital administration

## 2016-12-27 NOTE — Progress Notes (Signed)
Occupational Therapy Treatment Note  Pt making excellent progress.  Using stedy, she was able to stand at sink to perform grooming, and required min A for functional transfers.  Pt in good spirits today. Recommend SNF.  Will continue to follow.    12/27/16 1643  OT Visit Information  Last OT Received On 12/27/16  Assistance Needed +1  History of Present Illness Pt is 83 y/o female s/p L5-S1 decompression and fusion. 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 Pt also has hx of multiple falls.   Precautions  Precautions Back;Fall  Precaution Booklet Issued Yes (comment)  Precaution Comments pt able to recall 2/3 back precautions.  Required cueing for do not lift.    Required Braces or Orthoses Spinal Brace  Spinal Brace TLSO;Applied in sitting position  Pain Assessment  Pain Assessment 0-10  Pain Score 5  Pain Location back   Pain Descriptors / Indicators Operative site guarding;Grimacing;Guarding;Sharp;Radiating  Pain Intervention(s) Monitored during session  Cognition  Arousal/Alertness Awake/alert  Behavior During Therapy WFL for tasks assessed/performed  Overall Cognitive Status Within Functional Limits for tasks assessed  ADL  Overall ADL's  Needs assistance/impaired  Eating/Feeding Set up  Grooming Wash/dry hands;Wash/dry face;Oral care;Brushing hair;Min guard;Minimal assistance;Standing  Grooming Details (indicate cue type and reason) using stedy   Toilet Transfer Minimal assistance;Stand-pivot;BSC (Stedy )  Toileting- Clothing Manipulation and Hygiene Total assistance;Sit to/from stand  Functional mobility during ADLs Minimal assistance (stedy )  General ADL Comments Pt utilized stedy for functional transfers and to perform grooming standing at sink   Bed Mobility  Overal bed mobility Needs Assistance  Bed Mobility Rolling;Sidelying to Sit  Rolling Min assist  Sidelying to sit Mod assist  General bed mobility comments cues for  technique and assist to lift trunk   Balance  Overall balance assessment Needs assistance  Sitting-balance support Bilateral upper extremity supported;Feet supported  Sitting balance-Leahy Scale Fair  Standing balance support During functional activity;Single extremity supported  Standing balance-Leahy Scale Poor  Standing balance comment pt requires UE support   Transfers  Overall transfer level Needs assistance  Equipment used Rolling walker (2 wheeled);Ambulation equipment used  Transfer via [place] to/from [employer] Transfers  Sit to [employer] assist  Stand pivot transfers Min assist  General transfer comment assist to lift buttocks   OT - End of Session  Equipment Utilized During Treatment Rolling walker;Gait belt;Back brace  Activity Tolerance Patient tolerated treatment well  Patient left in chair;with call bell/phone within reach;with chair alarm set;with nursing/sitter in room  Nurse Communication Mobility status;Patient requests pain meds  OT Assessment/Plan  OT Plan Discharge plan remains appropriate  OT Visit Diagnosis Pain  OT Frequency (ACUTE ONLY) Min 2X/week  Follow Up Recommendations SNF  OT Equipment None recommended by OT  AM-PAC OT "6 Clicks" Daily Activity Outcome Measure  Help from another person eating meals? 3  Help from another person taking care of personal grooming? 3  Help from another person toileting, which includes using toliet, bedpan, or urinal? 2  Help from another person bathing (including washing, rinsing, drying)? 2  Help from another person to put on and taking off regular upper body clothing? 2  Help from another person to put on and taking off regular lower body clothing? 2  6 Click Score 14  ADL G Code Conversion CK  OT Goal Progression  Progress towards OT goals Progressing toward goals  OT Time Calculation  OT Start Time (  ACUTE ONLY) 1346  OT Stop Time (ACUTE ONLY) 1459  OT Time Calculation (min)  73 min  OT General Charges  $OT Visit 1 Procedure  OT Treatments  $Self Care/Home Management  68-82 mins  [facility], OTR/L [phone]

## 2016-12-27 NOTE — [place] FL2 (Signed)
[place] MEDICAID FL2 LEVEL OF CARE SCREENING TOOL     IDENTIFICATION  Patient Name: Kara Phelps Birthdate: 01-31-1934 Sex: female Admission Date (Current Location): 12/25/2016  [facility] and [place] Number:  Herbalist and Address:  [facility] [place] [place] [address], [place], [place] [address]      Provider Number: [id number]  Attending Physician Name and Address:  Phylliss Bob, MD  Relative Name and Phone Number:       Current Level of Care: Hospital Recommended Level of Care: [place] Prior Approval Number:    Date Approved/Denied:   PASRR Number: Need to get SS# from daughter  Discharge Plan: SNF    Current Diagnoses: Patient Active Problem List   Diagnosis Date Noted  . Radiculopathy 12/25/2016    Orientation RESPIRATION BLADDER Height & Weight     Self, Time, Situation, Place  Normal Continent Weight: 145 lb (65.8 kg) Height:  5\' 4"  (162.6 cm)  BEHAVIORAL SYMPTOMS/MOOD NEUROLOGICAL BOWEL NUTRITION STATUS   (None)  (None) Continent Diet (Carb modified)  AMBULATORY STATUS COMMUNICATION OF NEEDS Skin   Limited Assist Verbally Surgical wounds                       Personal Care Assistance Level of Assistance  Bathing, Feeding, Dressing Bathing Assistance: Maximum assistance Feeding assistance: Limited assistance Dressing Assistance: Maximum assistance     Functional Limitations Info  Sight, Hearing, Speech Sight Info: Adequate Hearing Info: Adequate Speech Info: Adequate    SPECIAL CARE FACTORS FREQUENCY  PT (By licensed PT), OT (By licensed OT)     PT Frequency: 5 x week OT Frequency: 5 x week            Contractures Contractures Info: Not present    Additional Factors Info  Allergies, Code Status Code Status Info: Full Allergies Info: NKDA           Current Medications (12/27/2016):  This is the current hospital active medication list Current Facility-Administered Medications   Medication Dose Route Frequency Provider Last Rate Last Dose  . 0.9 %  sodium chloride infusion  250 mL Intravenous Continuous Lennie Muckle McKenzie, PA-C 1 mL/hr at 12/25/16 2054 250 mL at 12/25/16 2054  . 0.9 % NaCl with KCl 20 mEq/ L  infusion   Intravenous Continuous Lennie Muckle McKenzie, PA-C 10 mL/hr at 12/25/16 2054    . acetaminophen (TYLENOL) tablet 650 mg  650 mg Oral Q4H PRN Lennie Muckle McKenzie, PA-C   650 mg at 12/27/16 0534   Or  . acetaminophen (TYLENOL) suppository 650 mg  650 mg Rectal Q4H PRN Lennie Muckle McKenzie, PA-C      . alum & mag hydroxide-simeth (MAALOX/MYLANTA) 200-200-20 MG/5ML suspension 30 mL  30 mL Oral Q6H PRN Kayla J McKenzie, PA-C      . bisacodyl (DULCOLAX) EC tablet 5 mg  5 mg Oral Daily PRN Kayla J McKenzie, PA-C      . diazepam (VALIUM) tablet 5 mg  5 mg Oral Q6H PRN Kayla J McKenzie, PA-C      . docusate sodium (COLACE) capsule 100 mg  100 mg Oral BID Lennie Muckle McKenzie, PA-C   100 mg at 12/27/16 1117  . folic acid (FOLVITE) tablet 0.5 mg  0.5 mg Oral Daily Phylliss Bob, MD   0.5 mg at 12/27/16 1117  . gabapentin (NEURONTIN) capsule 300 mg  300 mg Oral TID Lennie Muckle McKenzie, PA-C   300 mg at 12/27/16  1118  . glipiZIDE (GLUCOTROL XL) 24 hr tablet 2.5 mg  2.5 mg Oral Daily Lennie Muckle McKenzie, PA-C   2.5 mg at 12/27/16 0641  . menthol-cetylpyridinium (CEPACOL) lozenge 3 mg  1 lozenge Oral PRN Lennie Muckle McKenzie, PA-C       Or  . phenol (CHLORASEPTIC) mouth spray 1 spray  1 spray Mouth/Throat PRN Lennie Muckle McKenzie, PA-C      . metFORMIN (GLUCOPHAGE-XR) 24 hr tablet 500 mg  500 mg Oral Q breakfast Lennie Muckle McKenzie, PA-C   500 mg at 12/27/16 0857  . morphine 2 MG/ML injection 1-2 mg  1-2 mg Intravenous Q4H PRN Lennie Muckle McKenzie, PA-C   1 mg at 12/27/16 0540  . multivitamin (PROSIGHT) tablet 1 tablet  1 tablet Oral Daily Phylliss Bob, MD   1 tablet at 12/27/16 1119  . ondansetron (ZOFRAN) tablet 4 mg  4 mg Oral Q6H PRN Lennie Muckle McKenzie, PA-C       Or  . ondansetron (ZOFRAN) injection 4  mg  4 mg Intravenous Q6H PRN Lennie Muckle McKenzie, PA-C      . oxyCODONE-acetaminophen (PERCOCET/ROXICET) 5-325 MG per tablet 1-2 tablet  1-2 tablet Oral Q4H PRN Lennie Muckle McKenzie, PA-C   2 tablet at 12/27/16 1526  . pantoprazole (PROTONIX) EC tablet 40 mg  40 mg Oral QHS Phylliss Bob, MD   40 mg at 12/26/16 2115  . povidone-iodine (BETADINE) 7.5 % scrub   Topical Once Phylliss Bob, MD      . senna-docusate (Senokot-S) tablet 1 tablet  1 tablet Oral QHS PRN Lennie Muckle McKenzie, PA-C      . sertraline (ZOLOFT) tablet 50 mg  50 mg Oral Daily Lennie Muckle McKenzie, PA-C   50 mg at 12/27/16 1118  . sodium chloride flush (NS) 0.9 % injection 3 mL  3 mL Intravenous Q12H Lennie Muckle McKenzie, PA-C   3 mL at 12/26/16 2115  . sodium chloride flush (NS) 0.9 % injection 3 mL  3 mL Intravenous PRN Kayla J McKenzie, PA-C      . sodium phosphate (FLEET) 7-19 GM/118ML enema 1 enema  1 enema Rectal Once PRN Kayla J McKenzie, PA-C      . vitamin B-12 (CYANOCOBALAMIN) tablet 500 mcg  500 mcg Oral Daily Lennie Muckle McKenzie, PA-C   500 mcg at 12/27/16 1118  . zolpidem (AMBIEN) tablet 5 mg  5 mg Oral QHS PRN Justice Britain, PA-C         Discharge Medications: Please see discharge summary for a list of discharge medications.  Relevant Imaging Results:  Relevant Lab Results:   Additional Information Need to get SS# from daughter.  Candie Chroman, LCSW

## 2016-12-28 LAB — GLUCOSE, CAPILLARY: GLUCOSE-CAPILLARY: 215 mg/dL — AB (ref 65–99)

## 2016-12-28 MED ORDER — OXYCODONE HCL 5 MG PO TABS
5.0000 mg | ORAL_TABLET | Freq: Four times a day (QID) | ORAL | Status: DC | PRN
  Administered 2016-12-29 – 2016-12-30 (×2): 5 mg via ORAL
  Filled 2016-12-28 (×2): qty 1

## 2016-12-28 MED ORDER — ACETAMINOPHEN 325 MG PO TABS
650.0000 mg | ORAL_TABLET | Freq: Four times a day (QID) | ORAL | Status: DC
  Administered 2016-12-28 – 2016-12-30 (×7): 650 mg via ORAL
  Filled 2016-12-28 (×8): qty 2

## 2016-12-28 MED ORDER — ACETAMINOPHEN 325 MG PO TABS
650.0000 mg | ORAL_TABLET | Freq: Four times a day (QID) | ORAL | Status: DC
  Administered 2016-12-28 (×2): 650 mg via ORAL
  Filled 2016-12-28 (×2): qty 2

## 2016-12-28 NOTE — Progress Notes (Addendum)
    Patient doing well, awaiting SNF placement, but spilled hot coffee on her medial upper thighs this am, presently with icepack Has been mobilizing very slowly Denies R or L leg pain +tol PO and + flatus Patient expressed some concerns regarding the brace placement by staff when assisting in donning brace   Physical Exam: Vitals:   12/28/16 0600 12/28/16 0845  BP: 120/74 131/73  Pulse: 71 67  Resp: 18 20  Temp: 98.5 F (36.9 C) 98.5 F (36.9 C)    Dressing in place abd soft NVI Quarter-sized area of superficial blister with partial thickness thermal burn, with surrounding redness on right thigh, not much on left thigh  POD #1 s/p L5-S1 decompression and fusion, doing well with poor and slow mobilization.   - up with PT/OT, encourage ambulation - Percocet for pain, Valium for muscle spasms - Tylenol (when not getting percocet) for burn pain--will d/c percocet and order tylenol and oxycodone separately - d/c to SNF when bed becomes available

## 2016-12-29 LAB — GLUCOSE, CAPILLARY: GLUCOSE-CAPILLARY: 193 mg/dL — AB (ref 65–99)

## 2016-12-29 NOTE — Progress Notes (Signed)
Family member, daughter, Trilby Leaver, would like a phone call [phone] regarding discharge and placement, physical therapy recommendations tomorrow morning, 12/30/16

## 2016-12-29 NOTE — Progress Notes (Addendum)
    Patient doing well, awaiting SNF placement, but spilled hot coffee on her medial upper right thigh yesterday, sore Has been mobilizing very slowly Denies R or L leg pain +tol PO, +BM Patient expressed some concerns regarding whether she will be d/c to SNF today or tomorrow, indicates she is unaware of the plan and timing   Physical Exam: Vitals:   12/29/16 0147 12/29/16 0542  BP: 111/72 125/82  Pulse: 73 72  Resp: 20 18  Temp: 98.4 F (36.9 C) 98.4 F (36.9 C)    Dressing in place abd soft NVI 2-3 inch circular area of superficial blister, unroofed and covered with Tagaderm,  with partial thickness thermal burn, with surrounding redness on right thigh, not much on left thigh  POD #1 s/p L5-S1 decompression and fusion, doing well with poor and slow mobilization.   PATIENT MEDICALLY STABLE FOR D/C TO SNF  - up with PT/OT, encourage ambulation - Percocet for pain, Valium for muscle spasms - Tylenol (when not getting percocet) for burn pain - d/c to SNF when bed becomes available--? Today vs. tomorrow

## 2016-12-30 DIAGNOSIS — F339 Major depressive disorder, recurrent, unspecified: Secondary | ICD-10-CM | POA: Diagnosis not present

## 2016-12-30 DIAGNOSIS — Z9889 Other specified postprocedural states: Secondary | ICD-10-CM | POA: Diagnosis not present

## 2016-12-30 DIAGNOSIS — M6281 Muscle weakness (generalized): Secondary | ICD-10-CM | POA: Diagnosis not present

## 2016-12-30 DIAGNOSIS — R262 Difficulty in walking, not elsewhere classified: Secondary | ICD-10-CM | POA: Diagnosis not present

## 2016-12-30 DIAGNOSIS — Z85828 Personal history of other malignant neoplasm of skin: Secondary | ICD-10-CM | POA: Diagnosis not present

## 2016-12-30 DIAGNOSIS — M791 Myalgia: Secondary | ICD-10-CM | POA: Diagnosis not present

## 2016-12-30 DIAGNOSIS — F329 Major depressive disorder, single episode, unspecified: Secondary | ICD-10-CM | POA: Diagnosis not present

## 2016-12-30 DIAGNOSIS — H3530 Unspecified macular degeneration: Secondary | ICD-10-CM | POA: Diagnosis not present

## 2016-12-30 DIAGNOSIS — M5416 Radiculopathy, lumbar region: Secondary | ICD-10-CM | POA: Diagnosis not present

## 2016-12-30 DIAGNOSIS — Z4782 Encounter for orthopedic aftercare following scoliosis surgery: Secondary | ICD-10-CM | POA: Diagnosis not present

## 2016-12-30 DIAGNOSIS — F418 Other specified anxiety disorders: Secondary | ICD-10-CM | POA: Diagnosis not present

## 2016-12-30 DIAGNOSIS — G709 Myoneural disorder, unspecified: Secondary | ICD-10-CM | POA: Diagnosis not present

## 2016-12-30 DIAGNOSIS — M62838 Other muscle spasm: Secondary | ICD-10-CM | POA: Diagnosis not present

## 2016-12-30 DIAGNOSIS — M545 Low back pain: Secondary | ICD-10-CM | POA: Diagnosis not present

## 2016-12-30 DIAGNOSIS — M1990 Unspecified osteoarthritis, unspecified site: Secondary | ICD-10-CM | POA: Diagnosis not present

## 2016-12-30 DIAGNOSIS — F3289 Other specified depressive episodes: Secondary | ICD-10-CM | POA: Diagnosis not present

## 2016-12-30 DIAGNOSIS — R2689 Other abnormalities of gait and mobility: Secondary | ICD-10-CM | POA: Diagnosis not present

## 2016-12-30 DIAGNOSIS — E7800 Pure hypercholesterolemia, unspecified: Secondary | ICD-10-CM | POA: Diagnosis not present

## 2016-12-30 DIAGNOSIS — E119 Type 2 diabetes mellitus without complications: Secondary | ICD-10-CM | POA: Diagnosis not present

## 2016-12-30 DIAGNOSIS — M79604 Pain in right leg: Secondary | ICD-10-CM | POA: Diagnosis not present

## 2016-12-30 LAB — GLUCOSE, CAPILLARY: Glucose-Capillary: 219 mg/dL — ABNORMAL HIGH (ref 65–99)

## 2016-12-30 MED ORDER — INSULIN ASPART 100 UNIT/ML ~~LOC~~ SOLN
0.0000 [IU] | Freq: Three times a day (TID) | SUBCUTANEOUS | Status: DC
  Administered 2016-12-30: 5 [IU] via SUBCUTANEOUS

## 2016-12-30 MED ORDER — SILVER SULFADIAZINE 1 % EX CREA
TOPICAL_CREAM | Freq: Two times a day (BID) | CUTANEOUS | Status: DC
  Administered 2016-12-30: 09:00:00 via TOPICAL
  Filled 2016-12-30: qty 85

## 2016-12-30 NOTE — Clinical Social Work Placement (Signed)
   CLINICAL SOCIAL WORK PLACEMENT  NOTE  Date:  12/30/2016  Patient Details  Name: Kara Phelps MRN: [id number] Date of Birth: 1933/11/27  Clinical Social Work is seeking post-discharge placement for this patient at the [place] level of care (*CSW will initial, date and re-position this form in  chart as items are completed):  Yes   Patient/family provided with [place] Work Department's list of facilities offering this level of care within the geographic area requested by the patient (or if unable, by the patient's family).  Yes   Patient/family informed of their freedom to choose among providers that offer the needed level of care, that participate in Medicare, Medicaid or managed care program needed by the patient, have an available bed and are willing to accept the patient.  Yes   Patient/family informed of [facility]'s ownership interest in [facility] and [facility], as well as of the fact that they are under no obligation to receive care at these facilities.  PASRR submitted to EDS on 12/30/16     PASRR number received on 12/30/16     Existing PASRR number confirmed on       FL2 transmitted to all facilities in geographic area requested by pt/family on 12/30/16     FL2 transmitted to all facilities within larger geographic area on       Patient informed that his/her managed care company has contracts with or will negotiate with certain facilities, including the following:        Yes   Patient/family informed of bed offers received.  Patient chooses bed at [facility] recommends and patient chooses bed at      Patient to be transferred to [facility] on 12/30/16.  Patient to be transferred to facility by Daughter will transport by car     Patient family notified on 12/30/16 of transfer.  Name of family member notified:  Charlotte Sanes     PHYSICIAN Please prepare prescriptions      Additional Comment:    _______________________________________________ Candie Chroman, LCSW 12/30/2016, 1:00 PM

## 2016-12-30 NOTE — Discharge Summary (Signed)
Patient ID: Kara Phelps MRN: [id number] DOB/AGE: May 28, 1934 83 y.o.  Admit date: 12/25/2016 Discharge date: 12/30/2016  Admission Diagnoses:  Active Problems:   Radiculopathy   Discharge Diagnoses:  Same  Past Medical History:  Diagnosis Date  . Anxiety   . Arthritis   . Cancer ([place])    skin cancer removed from side of face   . Depression   . Diabetes ([place])   . High cholesterol   . History of pneumonia   . Macular degeneration    injections in left eye  . Neuromuscular disorder ([place])   . Pneumonia     Surgeries: Procedure(s): LUMBAR 5-SACRUM 1 DECOMPRESSION WITH  INSITU FUSION WITH  ALLOGRAFT on 12/25/2016   Consultants: Social Work for SNF Placement  Discharged Condition: Improved  Hospital Course: Kara Phelps is an 83 y.o. female who was admitted 12/25/2016 for operative treatment of radiculopathy. Patient has severe unremitting pain that affects sleep, daily activities, and work/hobbies. After pre-op clearance the patient was taken to the operating room on 12/25/2016 and underwent  Procedure(s): LUMBAR 5-SACRUM 1 DECOMPRESSION WITH  INSITU FUSION WITH  ALLOGRAFT.    Patient was given perioperative antibiotics: Anti-infectives    Start     Dose/Rate Route Frequency Ordered Stop   12/25/16 2200  ceFAZolin (ANCEF) IVPB 2g/100 mL premix     2 g 200 mL/hr over 30 Minutes Intravenous Every 8 hours 12/25/16 2005 12/26/16 0626   12/25/16 1300  ceFAZolin (ANCEF) IVPB 2g/100 mL premix     2 g 200 mL/hr over 30 Minutes Intravenous To [facility] 12/24/16 1307 12/25/16 1445       Patient was given sequential compression devices, early ambulation to prevent DVT.  Patient benefited maximally from hospital stay and there were no complications.    Recent vital signs: Patient Vitals for the past 24 hrs:  BP Temp Temp src Pulse Resp SpO2  12/30/16 0900 124/78 98.8 F (37.1 C) Oral 78 20 98 %  12/30/16 0420 136/86 98.8 F (37.1 C) Oral 71 20 97 %    12/30/16 0127 108/60 98.7 F (37.1 C) Oral 62 18 99 %  12/29/16 2122 (!) 143/91 99.1 F (37.3 C) Oral 81 20 97 %  12/29/16 1939 127/76 98.4 F (36.9 C) Oral 78 20 98 %  12/29/16 1330 (!) 150/107 98.1 F (36.7 C) Oral 84 20 91 %     Discharge Medications:    Allergies as of 12/30/2016      Reactions   No Known Allergies       Medication List    STOP taking these medications   acetaminophen 500 MG tablet Commonly known as:  TYLENOL     TAKE these medications   cyanocobalamin 500 MCG tablet Take 500 mcg by mouth daily.   FOCUSED MIND PO Take 1 tablet by mouth daily.   folic acid 867 MCG tablet Commonly known as:  FOLVITE Take 400 mcg by mouth daily.   gabapentin 300 MG capsule Commonly known as:  NEURONTIN Take 300 mg by mouth 3 (three) times daily.   glipiZIDE 2.5 MG 24 hr tablet Commonly known as:  GLUCOTROL XL Take 1 tablet by mouth daily.   metFORMIN 500 MG 24 hr tablet Commonly known as:  GLUCOPHAGE-XR Take 500 mg by mouth daily with breakfast.   Milk Thistle 175 MG Caps Take 1 capsule by mouth daily.   oxyCODONE-acetaminophen 5-325 MG tablet Commonly known as:  PERCOCET/ROXICET Take 1 tablet by mouth every 6 (six) hours  as needed for severe pain.   PRESERVISION AREDS PO Take 1 tablet by mouth daily.   sertraline 50 MG tablet Commonly known as:  ZOLOFT Take 50 mg by mouth daily.        Diagnostic Studies: Dg Chest 2 View  Result Date: 12/19/2016 CLINICAL DATA:  Sciatica. EXAM: CHEST  2 VIEW COMPARISON:  No recent prior . FINDINGS: Mediastinum and hilar structures are normal. Heart size stable. Mild cardiomegaly. No pulmonary venous congestion. No focal alveolar infiltrate. Mild bilateral interstitial prominence, possibly chronic noted. No pleural effusion or pneumothorax. Degenerative changes both shoulders. Loose bodies noted about the right shoulder. Degenerative changes thoracic spine . IMPRESSION: 1. Cardiomegaly.  No pulmonary venous  congestion. 2. Mild bilateral interstitial prominence, possibly chronic. No focal alveolar infiltrate noted. Electronically Signed   By: Marcello Moores  Register   On: 12/19/2016 15:23   Mr Brain Wo Contrast  Result Date: 12/08/2016  [facility] [address] [place], [place] [address] [phone] NEUROIMAGING REPORT STUDY DATE: 12/07/2016 PATIENT NAME: Kara Phelps DOB: 1934/06/03 MRN: [id number] EXAM: MRI Brain without contrast ORDERING CLINICIAN: Sarina Ill M.D. CLINICAL HISTORY: 83 year old woman with a gait disturbance, ataxia and parkinsonism COMPARISON FILMS: none TECHNIQUE: MRI of the brain without contrast was obtained utilizing 5 mm axial slices with T1, T2, T2 flair, SWI and diffusion weighted views.  T1 sagittal and T2 coronal views were obtained. CONTRAST: none IMAGING SITE: [facility], [place], [place] FINDINGS: On sagittal images, the spinal cord is imaged caudally to C2-C3 and is normal in caliber.   The contents of the posterior fossa are of normal size and position.   The pituitary gland and optic chiasm appear normal.    The third and lateral ventricles are enlarged, a little out of proportion to the extent of moderate cortical atrophy.  There is also mild cerebellar atrophy and corpus callosum fitting. There are no abnormal extra-axial collections of fluid.  The cerebellum and brainstem appears normal.   The deep gray matter appears normal.  There are T2/FLAIR hyperintense foci in the periventricular,  deep and subcortical white matter of both hemispheres. None of these appear to be acute. Diffusion weighted images are normal.  Susceptibility weighted images are normal.  The orbits appear normal.   The VIIth/VIIIth nerve complex appears normal.  The mastoid air cells appear normal.  Mild mucoperiosteal thickening is noted within some of the ethmoid air cells. The other paranasal sinuses appear normal.  Flow voids are identified within the major  intracerebral arteries.      This MRI of the brain without contrast shows the following: 1.    The ventricles appear to be enlarged a little out of proportion to the extent of moderate cortical atrophy. Normal pressure hydrocephalus cannot be ruled out. 2.     Mild-to-moderate chronic microvascular ischemic changes 3.     There are no acute findings. INTERPRETING PHYSICIAN: Richard A. Felecia Shelling, MD, PhD Certified in  Neuroimaging by [employer]   Dg Lumbar Spine 1 View  Result Date: 12/25/2016 CLINICAL DATA:  L5-S1 decompression/fusion EXAM: DG C-ARM 61-120 MIN; LUMBAR SPINE - 1 VIEW COMPARISON:  MRI lumbar spine dated 10/26/2016 FLUOROSCOPY TIME:  9 seconds FINDINGS: Single lateral view of the lumbar spine demonstrates surgical hardware posterior to the L5 vertebral body with a probe at L5-S1. IMPRESSION: Intraoperative fluoroscopic localization as above. Electronically Signed   By: Julian Hy M.D.   On: 12/25/2016 18:25   Dg Lumbar Spine 1  View  Result Date: 12/25/2016 CLINICAL DATA:  L5-S1 posterior decompression. EXAM: LUMBAR SPINE - 1 VIEW COMPARISON:  None. FINDINGS: Lateral intraoperative radiograph of the lumbar spine for localization. Surgical instrument indicates the spinous process of L3 and the interspinous space at L5-S1. Grade 1 L4-5 anterolisthesis. IMPRESSION: Intraoperative localization: Surgical instrument indicates the spinous process of L3 and the interspinous space at L5-S1. Electronically Signed   By: Elon Alas M.D.   On: 12/25/2016 17:44   Dg C-arm 1-60 Min  Result Date: 12/25/2016 CLINICAL DATA:  L5-S1 decompression/fusion EXAM: DG C-ARM 61-120 MIN; LUMBAR SPINE - 1 VIEW COMPARISON:  MRI lumbar spine dated 10/26/2016 FLUOROSCOPY TIME:  9 seconds FINDINGS: Single lateral view of the lumbar spine demonstrates surgical hardware posterior to the L5 vertebral body with a probe at L5-S1. IMPRESSION: Intraoperative fluoroscopic localization as above.  Electronically Signed   By: Julian Hy M.D.   On: 12/25/2016 18:25    Disposition: SNF   POD #5 s/p decompression and fusion doing well with resolved leg pain  - up with PT/OT 2x Today, encourage ambulation             Pt needs aggressive PT, did not receive any over the weekend which in unacceptable  - Ambulate pt in hallway Q4hrs  - Percocet for pain, Valium for muscle spasms PRN - d/c SNF today pending bed - Apply burn cream to coffee burn on medial thigh   -Written scripts for pain signed and in chart -D/C instructions sheet printed and in chart -D/C today  -F/U in office 2 weeks   Signed: Justice Britain 12/30/2016, 11:52 AM

## 2016-12-30 NOTE — Clinical Social Work Note (Signed)
CSW facilitated patient discharge including contacting patient family and facility to confirm patient discharge plans. Clinical information faxed to facility and family agreeable with plan. Patient's daughter will transport by car to [facility]. [facility] could not guarantee that insurance would cover ride so patient's preferred to go by car. RN to call report prior to discharge [phone]).  CSW will sign off for now as social work intervention is no longer needed. Please consult [place] again if new needs arise.  Kara Phelps, [place]

## 2016-12-30 NOTE — Progress Notes (Signed)
Patient discharged from room 5M15 at this time. Alert and in stable condition. IV site d/c'd and instructions read to patient and daughter with understanding verbalized. Report given to receiving nurse Dierdre Forth, LPN at [facility] with all questions answered. Left unit via wheelchair with all belongings at side.

## 2016-12-30 NOTE — Progress Notes (Signed)
Occupational Therapy Treatment Note  Pt presents to OT with improved functional mobility and ability to engage in ADLs.  She requires mod A for LB ADLs and min A for functional transfers.   She is discharging to SNF today.    12/30/16 1500  OT Visit Information  Last OT Received On 12/30/16  Assistance Needed +1  History of Present Illness Pt is 83 y/o female s/p L5-S1 decompression and fusion. 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 Pt also has hx of multiple falls.   Precautions  Precautions Back;Fall  Precaution Booklet Issued Yes (comment)  Precaution Comments Pt requires mod verbal cues to adhere to precautions   Required Braces or Orthoses Spinal Brace  Spinal Brace TLSO;Applied in sitting position  Pain Assessment  Pain Assessment Faces  Faces Pain Scale 4  Pain Location back   Pain Descriptors / Indicators Operative site guarding;Grimacing;Guarding;Sharp;Radiating  Pain Intervention(s) Monitored during session  Cognition  Arousal/Alertness Awake/alert  Behavior During Therapy WFL for tasks assessed/performed  Overall Cognitive Status Within Functional Limits for tasks assessed  General Comments Pt able to recall events of today and of last week   ADL  Overall ADL's  Needs assistance/impaired  Grooming Wash/dry hands;Min guard;Standing  Lower Body Dressing Moderate assistance;Sit to/from stand  Lower Body Dressing Details (indicate cue type and reason) Pt able to cross ankles over knees, but difficulty pulling shoes over heels   Toilet Transfer Minimal assistance;Ambulation;Comfort height toilet;BSC;Grab bars;RW  Toileting- Clothing Manipulation and Hygiene Minimal assistance;Sit to/from stand  Toileting - Clothing Manipulation Details (indicate cue type and reason) Pt requires assist with fasteners due to brace blocking her view   Functional mobility during ADLs Minimal assistance;Rolling walker  General ADL Comments Reviewed  back precautions and how to don brace correctly   Bed Mobility  General bed mobility comments in chair   Balance  Overall balance assessment Needs assistance  Sitting-balance support Feet supported  Sitting balance-Leahy Scale Fair  Standing balance support No upper extremity supported  Standing balance-Leahy Scale Fair  Vision- Assessment  Additional Comments low vision   Transfers  Overall transfer level Needs assistance  Equipment used Rolling walker (2 wheeled);Ambulation equipment used  Transfers Sit to/from [facility]  Sit to [employer] assist  Stand pivot transfers Min assist  OT - End of Session  Equipment Utilized During Treatment Rolling walker;Back brace  Activity Tolerance Patient tolerated treatment well  Patient left in chair;with call bell/phone within reach;with chair alarm set  Nurse Communication Mobility status  OT Assessment/Plan  OT Plan Discharge plan remains appropriate  OT Visit Diagnosis Pain  OT Frequency (ACUTE ONLY) Min 2X/week  Follow Up Recommendations SNF  OT Equipment None recommended by OT  AM-PAC OT "6 Clicks" Daily Activity Outcome Measure  Help from another person eating meals? 3  Help from another person taking care of personal grooming? 3  Help from another person toileting, which includes using toliet, bedpan, or urinal? 3  Help from another person bathing (including washing, rinsing, drying)? 2  Help from another person to put on and taking off regular upper body clothing? 3  Help from another person to put on and taking off regular lower body clothing? 2  6 Click Score 16  ADL G Code Conversion CK  OT Goal Progression  Progress towards OT goals Progressing toward goals  OT Time Calculation  OT Start Time (ACUTE ONLY) 1515  OT Stop Time (ACUTE ONLY) 1539  OT Time  Calculation (min) 24 min  OT General Charges  $OT Visit 1 Procedure  OT Treatments  $Self Care/Home Management  23-37 mins  [facility],  OTR/L [phone]

## 2016-12-30 NOTE — Progress Notes (Signed)
    Patient doing well with resolved leg pain, minimal PO LBP predominantly stiffness and soreness. Pt very displeased with care over the weekend, she has not had any PT or OT since Friday. She states she has been mobilized minimally. She is eager to get up and ambulate and proceed to SNF. She has not heard from social work recently regarding placement. She chose a facility in [place].     Physical Exam: Vitals:   12/30/16 0127 12/30/16 0420  BP: 108/60 136/86  Pulse: 62 71  Resp: 18 20  Temp: 98.7 F (37.1 C) 98.8 F (37.1 C)    Dressing in place, CDI, pt resting in bed comfortably NVI  POD #5 s/p decompression and fusion doing well with resolved leg pain  - up with PT/OT 2x Today, encourage ambulation  Pt needs aggressive PT, did not receive any over the weekend which in unacceptable  - Ambulate pt in hallway Q4hrs  - Percocet for pain, Valium for muscle spasms PRN - likely d/c SNF today pending bed  -Social work please see pt and update on placement ASAP and call daughter regarding placement update and transportation options  -Apply burn cream to coffee burn on medial thigh

## 2016-12-30 NOTE — Clinical Social Work Note (Signed)
[facility] has not yet responded to referral. CSW left voicemail for admissions coordinator. CSW called patient's daughter regarding SS#. She stated that an RN put it in a note. Note located and SS# obtained. Patient's daughter voiced frustration regarding staff and checking the patient's blood sugars. She stated that she has contacted hospital leadership about this. PASARR obtained: [phone] A. Admissions coordinator at [facility] called [facility] back and left voicemail stating that they are able to extend a bed offer.  Dayton Scrape, [place]
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
## 2016-12-30 NOTE — Care Management Note (Signed)
Case Management Note  Patient Details  Name: Kara Phelps MRN: [id number] Date of Birth: April 26, 1934  Subjective/Objective:                    Action/Plan: Pt discharging to [facility] today. No further needs per CM.   Expected Discharge Date:  12/30/16               Expected Discharge Plan:  Skilled Nursing Facility  In-House Referral:  Clinical Social Work  Discharge planning Services     Post Acute Care Choice:    Choice offered to:     DME Arranged:    DME Agency:     HH Arranged:    [place] Agency:     Status of Service:  Completed, signed off  If discussed at [employer], dates discussed:    Additional Comments:  Pollie Friar, RN 12/30/2016, 12:01 PM

## 2016-12-30 NOTE — Care Management Important Message (Signed)
Important Message  Patient Details  Name: Kara Phelps MRN: [id number] Date of Birth: [birth year]   Medicare Important Message Given:  Yes    [name] 12/30/2016, 2:11 PM

## 2016-12-30 NOTE — Progress Notes (Signed)
During shift change and bedside report, patient complained of not ambulated by staff since she's been here and also that she has not been seen by PT. Writer opened up chart in patient's room to verify patient's concern and noted PT and OT has been seeing her since admission but not on this weekend. Writer tried to explain to patient that she did receive therapy but she refused to agree. MD's PA was in patient's room while these concerns were made. Writer promised patient that this staff will make sure she ambulates down the hallway today. Patient also complained to writer during morning med pass that her blood sugar was not checked as she is a diabetic. Writer noted there are no orders for routine check with coverage as needed. As it was past breakfast time and patient had already eaten, Probation officer notified patient that MD will be notified and the next CBG check will be done at lunch time. Writer received a call from patient's daughter as she was upset on the phone about care over the weekend. Writer tried to calm her and assure her that orders will be received from MD and it will be carried out. Writer explained to daughter that metformin and Glipizide had already been given. Patient later found taking her own pills in her room and when asked by writer what meds she was taking, patient stated to leave her alone she will do whatever she wants. Writer also made Therapist, sports aware of concerns and went in room together to talk with patient. Orders received from MD and carried out. Patient will be discharged today to [facility]

## 2016-12-31 DIAGNOSIS — M545 Low back pain: Secondary | ICD-10-CM | POA: Diagnosis not present

## 2016-12-31 DIAGNOSIS — R2689 Other abnormalities of gait and mobility: Secondary | ICD-10-CM | POA: Diagnosis not present

## 2016-12-31 DIAGNOSIS — M62838 Other muscle spasm: Secondary | ICD-10-CM | POA: Diagnosis not present

## 2017-01-01 DIAGNOSIS — E119 Type 2 diabetes mellitus without complications: Secondary | ICD-10-CM | POA: Diagnosis not present

## 2017-01-01 DIAGNOSIS — M6281 Muscle weakness (generalized): Secondary | ICD-10-CM | POA: Diagnosis not present

## 2017-01-01 DIAGNOSIS — F339 Major depressive disorder, recurrent, unspecified: Secondary | ICD-10-CM | POA: Diagnosis not present

## 2017-01-01 DIAGNOSIS — M5416 Radiculopathy, lumbar region: Secondary | ICD-10-CM | POA: Diagnosis not present

## 2017-01-08 DIAGNOSIS — Z9889 Other specified postprocedural states: Secondary | ICD-10-CM | POA: Diagnosis not present

## 2017-01-14 DIAGNOSIS — F3289 Other specified depressive episodes: Secondary | ICD-10-CM | POA: Diagnosis not present

## 2017-01-14 DIAGNOSIS — R2689 Other abnormalities of gait and mobility: Secondary | ICD-10-CM | POA: Diagnosis not present

## 2017-01-14 DIAGNOSIS — M791 Myalgia: Secondary | ICD-10-CM | POA: Diagnosis not present

## 2017-01-14 DIAGNOSIS — M5416 Radiculopathy, lumbar region: Secondary | ICD-10-CM | POA: Diagnosis not present

## 2017-01-14 DIAGNOSIS — M79604 Pain in right leg: Secondary | ICD-10-CM | POA: Diagnosis not present

## 2017-01-20 DIAGNOSIS — R262 Difficulty in walking, not elsewhere classified: Secondary | ICD-10-CM | POA: Diagnosis not present

## 2017-01-20 DIAGNOSIS — E119 Type 2 diabetes mellitus without complications: Secondary | ICD-10-CM | POA: Diagnosis not present

## 2017-01-20 DIAGNOSIS — F339 Major depressive disorder, recurrent, unspecified: Secondary | ICD-10-CM | POA: Diagnosis not present

## 2017-01-22 DIAGNOSIS — M1990 Unspecified osteoarthritis, unspecified site: Secondary | ICD-10-CM | POA: Diagnosis not present

## 2017-01-22 DIAGNOSIS — H3530 Unspecified macular degeneration: Secondary | ICD-10-CM | POA: Diagnosis not present

## 2017-01-22 DIAGNOSIS — E119 Type 2 diabetes mellitus without complications: Secondary | ICD-10-CM | POA: Diagnosis not present

## 2017-01-22 DIAGNOSIS — M5416 Radiculopathy, lumbar region: Secondary | ICD-10-CM | POA: Diagnosis not present

## 2017-01-22 DIAGNOSIS — G709 Myoneural disorder, unspecified: Secondary | ICD-10-CM | POA: Diagnosis not present

## 2017-01-22 DIAGNOSIS — Z4789 Encounter for other orthopedic aftercare: Secondary | ICD-10-CM | POA: Diagnosis not present

## 2017-01-25 DIAGNOSIS — H3530 Unspecified macular degeneration: Secondary | ICD-10-CM | POA: Diagnosis not present

## 2017-01-25 DIAGNOSIS — G709 Myoneural disorder, unspecified: Secondary | ICD-10-CM | POA: Diagnosis not present

## 2017-01-25 DIAGNOSIS — M1990 Unspecified osteoarthritis, unspecified site: Secondary | ICD-10-CM | POA: Diagnosis not present

## 2017-01-25 DIAGNOSIS — Z4789 Encounter for other orthopedic aftercare: Secondary | ICD-10-CM | POA: Diagnosis not present

## 2017-01-25 DIAGNOSIS — M5416 Radiculopathy, lumbar region: Secondary | ICD-10-CM | POA: Diagnosis not present

## 2017-01-25 DIAGNOSIS — E119 Type 2 diabetes mellitus without complications: Secondary | ICD-10-CM | POA: Diagnosis not present

## 2017-01-27 DIAGNOSIS — H3530 Unspecified macular degeneration: Secondary | ICD-10-CM | POA: Diagnosis not present

## 2017-01-27 DIAGNOSIS — G709 Myoneural disorder, unspecified: Secondary | ICD-10-CM | POA: Diagnosis not present

## 2017-01-27 DIAGNOSIS — E119 Type 2 diabetes mellitus without complications: Secondary | ICD-10-CM | POA: Diagnosis not present

## 2017-01-27 DIAGNOSIS — Z4789 Encounter for other orthopedic aftercare: Secondary | ICD-10-CM | POA: Diagnosis not present

## 2017-01-27 DIAGNOSIS — M5416 Radiculopathy, lumbar region: Secondary | ICD-10-CM | POA: Diagnosis not present

## 2017-01-27 DIAGNOSIS — M1990 Unspecified osteoarthritis, unspecified site: Secondary | ICD-10-CM | POA: Diagnosis not present

## 2017-01-29 DIAGNOSIS — H3530 Unspecified macular degeneration: Secondary | ICD-10-CM | POA: Diagnosis not present

## 2017-01-29 DIAGNOSIS — Z4789 Encounter for other orthopedic aftercare: Secondary | ICD-10-CM | POA: Diagnosis not present

## 2017-01-29 DIAGNOSIS — M5416 Radiculopathy, lumbar region: Secondary | ICD-10-CM | POA: Diagnosis not present

## 2017-01-29 DIAGNOSIS — E119 Type 2 diabetes mellitus without complications: Secondary | ICD-10-CM | POA: Diagnosis not present

## 2017-01-29 DIAGNOSIS — M1990 Unspecified osteoarthritis, unspecified site: Secondary | ICD-10-CM | POA: Diagnosis not present

## 2017-01-29 DIAGNOSIS — G709 Myoneural disorder, unspecified: Secondary | ICD-10-CM | POA: Diagnosis not present

## 2017-01-30 DIAGNOSIS — F418 Other specified anxiety disorders: Secondary | ICD-10-CM | POA: Diagnosis not present

## 2017-01-30 DIAGNOSIS — M549 Dorsalgia, unspecified: Secondary | ICD-10-CM | POA: Diagnosis not present

## 2017-01-30 DIAGNOSIS — Z7984 Long term (current) use of oral hypoglycemic drugs: Secondary | ICD-10-CM | POA: Diagnosis not present

## 2017-01-30 DIAGNOSIS — R262 Difficulty in walking, not elsewhere classified: Secondary | ICD-10-CM | POA: Diagnosis not present

## 2017-01-30 DIAGNOSIS — E119 Type 2 diabetes mellitus without complications: Secondary | ICD-10-CM | POA: Diagnosis not present

## 2017-01-31 DIAGNOSIS — Z4789 Encounter for other orthopedic aftercare: Secondary | ICD-10-CM | POA: Diagnosis not present

## 2017-01-31 DIAGNOSIS — E119 Type 2 diabetes mellitus without complications: Secondary | ICD-10-CM | POA: Diagnosis not present

## 2017-01-31 DIAGNOSIS — M1990 Unspecified osteoarthritis, unspecified site: Secondary | ICD-10-CM | POA: Diagnosis not present

## 2017-01-31 DIAGNOSIS — G709 Myoneural disorder, unspecified: Secondary | ICD-10-CM | POA: Diagnosis not present

## 2017-01-31 DIAGNOSIS — H3530 Unspecified macular degeneration: Secondary | ICD-10-CM | POA: Diagnosis not present

## 2017-01-31 DIAGNOSIS — M5416 Radiculopathy, lumbar region: Secondary | ICD-10-CM | POA: Diagnosis not present

## 2017-02-03 DIAGNOSIS — G709 Myoneural disorder, unspecified: Secondary | ICD-10-CM | POA: Diagnosis not present

## 2017-02-03 DIAGNOSIS — M1990 Unspecified osteoarthritis, unspecified site: Secondary | ICD-10-CM | POA: Diagnosis not present

## 2017-02-03 DIAGNOSIS — E119 Type 2 diabetes mellitus without complications: Secondary | ICD-10-CM | POA: Diagnosis not present

## 2017-02-03 DIAGNOSIS — Z4789 Encounter for other orthopedic aftercare: Secondary | ICD-10-CM | POA: Diagnosis not present

## 2017-02-03 DIAGNOSIS — M5416 Radiculopathy, lumbar region: Secondary | ICD-10-CM | POA: Diagnosis not present

## 2017-02-03 DIAGNOSIS — H3530 Unspecified macular degeneration: Secondary | ICD-10-CM | POA: Diagnosis not present

## 2017-02-04 DIAGNOSIS — M5416 Radiculopathy, lumbar region: Secondary | ICD-10-CM | POA: Diagnosis not present

## 2017-02-05 DIAGNOSIS — G709 Myoneural disorder, unspecified: Secondary | ICD-10-CM | POA: Diagnosis not present

## 2017-02-05 DIAGNOSIS — H3530 Unspecified macular degeneration: Secondary | ICD-10-CM | POA: Diagnosis not present

## 2017-02-05 DIAGNOSIS — Z4789 Encounter for other orthopedic aftercare: Secondary | ICD-10-CM | POA: Diagnosis not present

## 2017-02-05 DIAGNOSIS — M1990 Unspecified osteoarthritis, unspecified site: Secondary | ICD-10-CM | POA: Diagnosis not present

## 2017-02-05 DIAGNOSIS — J209 Acute bronchitis, unspecified: Secondary | ICD-10-CM | POA: Diagnosis not present

## 2017-02-05 DIAGNOSIS — M5416 Radiculopathy, lumbar region: Secondary | ICD-10-CM | POA: Diagnosis not present

## 2017-02-05 DIAGNOSIS — E119 Type 2 diabetes mellitus without complications: Secondary | ICD-10-CM | POA: Diagnosis not present

## 2017-02-07 DIAGNOSIS — G709 Myoneural disorder, unspecified: Secondary | ICD-10-CM | POA: Diagnosis not present

## 2017-02-07 DIAGNOSIS — M5416 Radiculopathy, lumbar region: Secondary | ICD-10-CM | POA: Diagnosis not present

## 2017-02-07 DIAGNOSIS — H3530 Unspecified macular degeneration: Secondary | ICD-10-CM | POA: Diagnosis not present

## 2017-02-07 DIAGNOSIS — E119 Type 2 diabetes mellitus without complications: Secondary | ICD-10-CM | POA: Diagnosis not present

## 2017-02-07 DIAGNOSIS — Z4789 Encounter for other orthopedic aftercare: Secondary | ICD-10-CM | POA: Diagnosis not present

## 2017-02-07 DIAGNOSIS — M1990 Unspecified osteoarthritis, unspecified site: Secondary | ICD-10-CM | POA: Diagnosis not present

## 2017-03-03 ENCOUNTER — Ambulatory Visit: Payer: Medicare Other | Admitting: Neurology

## 2017-03-03 ENCOUNTER — Telehealth: Payer: Self-pay

## 2017-03-03 NOTE — Telephone Encounter (Signed)
Pt no showed her appt today

## 2017-03-05 ENCOUNTER — Encounter: Payer: Self-pay | Admitting: Neurology

## 2017-06-02 DIAGNOSIS — M1712 Unilateral primary osteoarthritis, left knee: Secondary | ICD-10-CM | POA: Diagnosis not present

## 2017-06-02 DIAGNOSIS — M25562 Pain in left knee: Secondary | ICD-10-CM | POA: Diagnosis not present

## 2017-06-11 DIAGNOSIS — M1712 Unilateral primary osteoarthritis, left knee: Secondary | ICD-10-CM | POA: Diagnosis not present

## 2017-06-18 DIAGNOSIS — M1712 Unilateral primary osteoarthritis, left knee: Secondary | ICD-10-CM | POA: Diagnosis not present

## 2017-06-25 DIAGNOSIS — M1712 Unilateral primary osteoarthritis, left knee: Secondary | ICD-10-CM | POA: Diagnosis not present

## 2017-07-02 DIAGNOSIS — M1712 Unilateral primary osteoarthritis, left knee: Secondary | ICD-10-CM | POA: Diagnosis not present
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
## 2018-10-05 IMAGING — RF DG C-ARM 61-120 MIN
1 series · 1 of 1 positions shown · non-contrast
Comparison: MRI lumbar spine dated 10/26/2016

FLUOROSCOPY TIME:  9 seconds

CLINICAL DATA: L5-S1 decompression/fusion

EXAM:
DG C-ARM 61-120 MIN; LUMBAR SPINE - 1 VIEW

[Series 1: run · 1 of 1 slices shown]
[im 1/1]
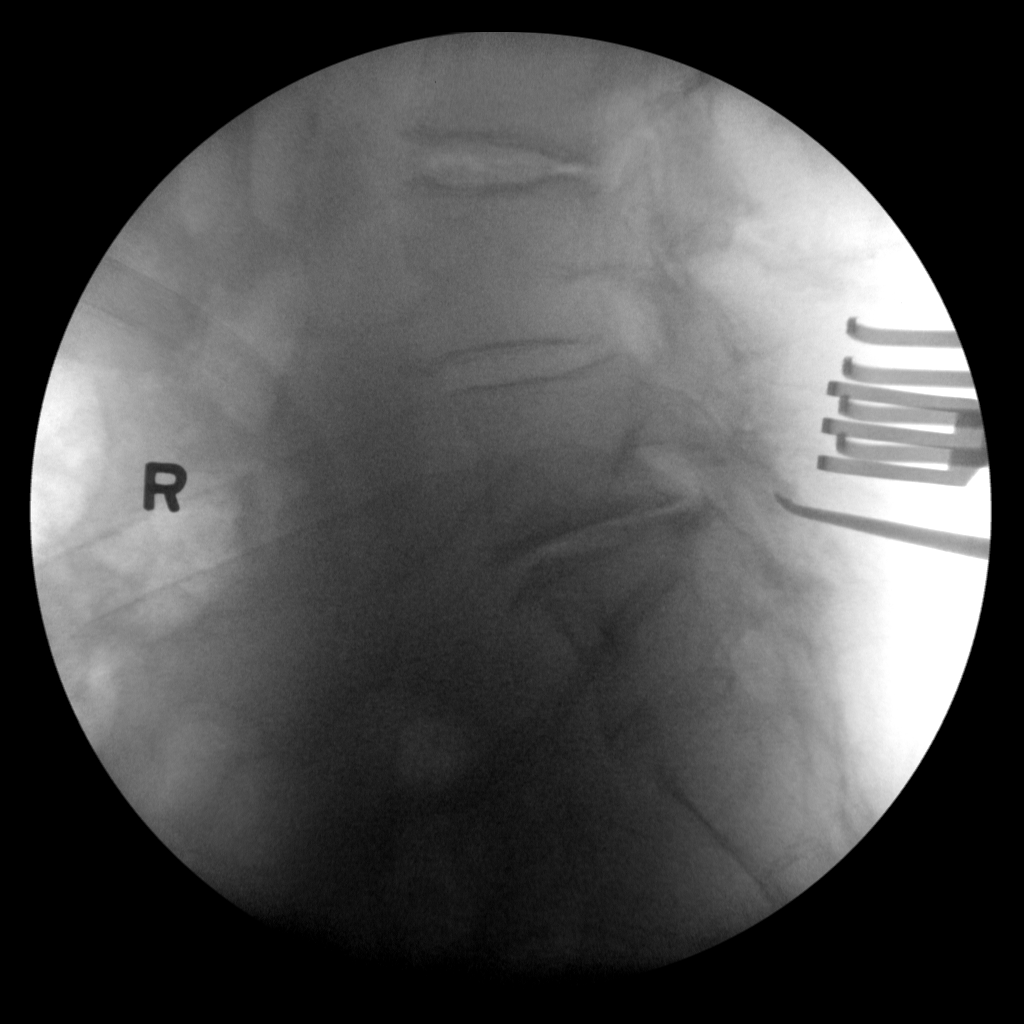

[1 of 1 positions shown; findings below may reference images not displayed]

FINDINGS: Single lateral view of the lumbar spine demonstrates surgical
hardware posterior to the L5 vertebral body with a probe at L5-S1.
IMPRESSION: Intraoperative fluoroscopic localization as above.

## 2019-01-26 DIAGNOSIS — L89621 Pressure ulcer of left heel, stage 1: Secondary | ICD-10-CM | POA: Diagnosis not present

## 2019-01-26 DIAGNOSIS — R531 Weakness: Secondary | ICD-10-CM | POA: Diagnosis not present

## 2019-01-26 DIAGNOSIS — L89892 Pressure ulcer of other site, stage 2: Secondary | ICD-10-CM | POA: Diagnosis not present

## 2019-01-26 DIAGNOSIS — F329 Major depressive disorder, single episode, unspecified: Secondary | ICD-10-CM | POA: Diagnosis not present

## 2019-01-26 DIAGNOSIS — R627 Adult failure to thrive: Secondary | ICD-10-CM | POA: Diagnosis not present

## 2019-01-26 DIAGNOSIS — Z7401 Bed confinement status: Secondary | ICD-10-CM | POA: Diagnosis not present

## 2019-01-26 DIAGNOSIS — M2550 Pain in unspecified joint: Secondary | ICD-10-CM | POA: Diagnosis not present

## 2019-01-26 DIAGNOSIS — T383X6A Underdosing of insulin and oral hypoglycemic [antidiabetic] drugs, initial encounter: Secondary | ICD-10-CM | POA: Diagnosis not present

## 2019-01-26 DIAGNOSIS — R5381 Other malaise: Secondary | ICD-10-CM | POA: Diagnosis not present

## 2019-01-26 DIAGNOSIS — Z9114 Patient's other noncompliance with medication regimen: Secondary | ICD-10-CM | POA: Diagnosis not present

## 2019-01-26 DIAGNOSIS — R9431 Abnormal electrocardiogram [ECG] [EKG]: Secondary | ICD-10-CM | POA: Diagnosis not present

## 2019-01-26 DIAGNOSIS — Z7984 Long term (current) use of oral hypoglycemic drugs: Secondary | ICD-10-CM | POA: Diagnosis not present

## 2019-01-26 DIAGNOSIS — E1165 Type 2 diabetes mellitus with hyperglycemia: Secondary | ICD-10-CM | POA: Diagnosis not present

## 2019-01-27 DIAGNOSIS — E0822 Diabetes mellitus due to underlying condition with diabetic chronic kidney disease: Secondary | ICD-10-CM | POA: Diagnosis not present

## 2019-01-27 DIAGNOSIS — L89629 Pressure ulcer of left heel, unspecified stage: Secondary | ICD-10-CM | POA: Diagnosis present

## 2019-01-27 DIAGNOSIS — R1312 Dysphagia, oropharyngeal phase: Secondary | ICD-10-CM | POA: Diagnosis not present

## 2019-01-27 DIAGNOSIS — F064 Anxiety disorder due to known physiological condition: Secondary | ICD-10-CM | POA: Diagnosis not present

## 2019-01-27 DIAGNOSIS — Z96641 Presence of right artificial hip joint: Secondary | ICD-10-CM | POA: Diagnosis present

## 2019-01-27 DIAGNOSIS — F331 Major depressive disorder, recurrent, moderate: Secondary | ICD-10-CM | POA: Diagnosis not present

## 2019-01-27 DIAGNOSIS — M4326 Fusion of spine, lumbar region: Secondary | ICD-10-CM | POA: Diagnosis present

## 2019-01-27 DIAGNOSIS — D649 Anemia, unspecified: Secondary | ICD-10-CM | POA: Diagnosis present

## 2019-01-27 DIAGNOSIS — L89619 Pressure ulcer of right heel, unspecified stage: Secondary | ICD-10-CM | POA: Diagnosis present

## 2019-01-27 DIAGNOSIS — E1159 Type 2 diabetes mellitus with other circulatory complications: Secondary | ICD-10-CM | POA: Diagnosis present

## 2019-01-27 DIAGNOSIS — N182 Chronic kidney disease, stage 2 (mild): Secondary | ICD-10-CM | POA: Diagnosis not present

## 2019-01-27 DIAGNOSIS — Z96642 Presence of left artificial hip joint: Secondary | ICD-10-CM | POA: Diagnosis present

## 2019-01-27 DIAGNOSIS — R5381 Other malaise: Secondary | ICD-10-CM | POA: Diagnosis not present

## 2019-01-27 DIAGNOSIS — Z96651 Presence of right artificial knee joint: Secondary | ICD-10-CM | POA: Diagnosis present

## 2019-01-27 DIAGNOSIS — M4699 Unspecified inflammatory spondylopathy, multiple sites in spine: Secondary | ICD-10-CM | POA: Diagnosis present

## 2019-01-27 DIAGNOSIS — D6489 Other specified anemias: Secondary | ICD-10-CM | POA: Diagnosis not present

## 2019-01-27 DIAGNOSIS — F418 Other specified anxiety disorders: Secondary | ICD-10-CM | POA: Diagnosis not present

## 2019-01-27 DIAGNOSIS — L299 Pruritus, unspecified: Secondary | ICD-10-CM | POA: Diagnosis not present

## 2019-01-27 DIAGNOSIS — R296 Repeated falls: Secondary | ICD-10-CM | POA: Diagnosis present

## 2019-01-29 DIAGNOSIS — R1312 Dysphagia, oropharyngeal phase: Secondary | ICD-10-CM | POA: Diagnosis not present

## 2019-01-29 DIAGNOSIS — M4326 Fusion of spine, lumbar region: Secondary | ICD-10-CM | POA: Diagnosis not present

## 2019-01-29 DIAGNOSIS — R5381 Other malaise: Secondary | ICD-10-CM | POA: Diagnosis not present

## 2019-01-29 DIAGNOSIS — L299 Pruritus, unspecified: Secondary | ICD-10-CM | POA: Diagnosis not present

## 2019-02-01 DIAGNOSIS — N182 Chronic kidney disease, stage 2 (mild): Secondary | ICD-10-CM | POA: Diagnosis not present

## 2019-02-01 DIAGNOSIS — E0822 Diabetes mellitus due to underlying condition with diabetic chronic kidney disease: Secondary | ICD-10-CM | POA: Diagnosis not present

## 2019-02-04 DIAGNOSIS — D6489 Other specified anemias: Secondary | ICD-10-CM | POA: Diagnosis not present

## 2019-02-04 DIAGNOSIS — R5381 Other malaise: Secondary | ICD-10-CM | POA: Diagnosis not present

## 2019-02-04 DIAGNOSIS — E0822 Diabetes mellitus due to underlying condition with diabetic chronic kidney disease: Secondary | ICD-10-CM | POA: Diagnosis not present

## 2019-02-05 DIAGNOSIS — E0822 Diabetes mellitus due to underlying condition with diabetic chronic kidney disease: Secondary | ICD-10-CM | POA: Diagnosis not present

## 2019-02-05 DIAGNOSIS — N182 Chronic kidney disease, stage 2 (mild): Secondary | ICD-10-CM | POA: Diagnosis not present

## 2019-02-10 DIAGNOSIS — F418 Other specified anxiety disorders: Secondary | ICD-10-CM | POA: Diagnosis not present

## 2019-02-10 DIAGNOSIS — E0822 Diabetes mellitus due to underlying condition with diabetic chronic kidney disease: Secondary | ICD-10-CM | POA: Diagnosis not present

## 2019-02-10 DIAGNOSIS — N182 Chronic kidney disease, stage 2 (mild): Secondary | ICD-10-CM | POA: Diagnosis not present

## 2019-02-17 DIAGNOSIS — E0822 Diabetes mellitus due to underlying condition with diabetic chronic kidney disease: Secondary | ICD-10-CM | POA: Diagnosis not present

## 2019-02-17 DIAGNOSIS — N182 Chronic kidney disease, stage 2 (mild): Secondary | ICD-10-CM | POA: Diagnosis not present

## 2019-02-17 DIAGNOSIS — F418 Other specified anxiety disorders: Secondary | ICD-10-CM | POA: Diagnosis not present

## 2019-03-03 DIAGNOSIS — S91301A Unspecified open wound, right foot, initial encounter: Secondary | ICD-10-CM | POA: Diagnosis not present

## 2019-03-03 DIAGNOSIS — N182 Chronic kidney disease, stage 2 (mild): Secondary | ICD-10-CM | POA: Diagnosis not present

## 2019-03-03 DIAGNOSIS — E0822 Diabetes mellitus due to underlying condition with diabetic chronic kidney disease: Secondary | ICD-10-CM | POA: Diagnosis not present

## 2019-03-03 DIAGNOSIS — R5381 Other malaise: Secondary | ICD-10-CM | POA: Diagnosis not present

## 2019-03-03 DIAGNOSIS — F418 Other specified anxiety disorders: Secondary | ICD-10-CM | POA: Diagnosis not present

## 2019-03-05 DIAGNOSIS — Z9181 History of falling: Secondary | ICD-10-CM | POA: Diagnosis not present

## 2019-03-05 DIAGNOSIS — E1122 Type 2 diabetes mellitus with diabetic chronic kidney disease: Secondary | ICD-10-CM | POA: Diagnosis not present

## 2019-03-05 DIAGNOSIS — N182 Chronic kidney disease, stage 2 (mild): Secondary | ICD-10-CM | POA: Diagnosis not present

## 2019-03-05 DIAGNOSIS — F419 Anxiety disorder, unspecified: Secondary | ICD-10-CM | POA: Diagnosis not present

## 2019-03-05 DIAGNOSIS — D631 Anemia in chronic kidney disease: Secondary | ICD-10-CM | POA: Diagnosis not present

## 2019-03-05 DIAGNOSIS — K5900 Constipation, unspecified: Secondary | ICD-10-CM | POA: Diagnosis not present

## 2019-03-05 DIAGNOSIS — H548 Legal blindness, as defined in USA: Secondary | ICD-10-CM | POA: Diagnosis not present

## 2019-03-05 DIAGNOSIS — Z981 Arthrodesis status: Secondary | ICD-10-CM | POA: Diagnosis not present

## 2019-03-05 DIAGNOSIS — H3530 Unspecified macular degeneration: Secondary | ICD-10-CM | POA: Diagnosis not present

## 2019-03-05 DIAGNOSIS — M4690 Unspecified inflammatory spondylopathy, site unspecified: Secondary | ICD-10-CM | POA: Diagnosis not present

## 2019-03-05 DIAGNOSIS — E519 Thiamine deficiency, unspecified: Secondary | ICD-10-CM | POA: Diagnosis not present

## 2019-03-05 DIAGNOSIS — F329 Major depressive disorder, single episode, unspecified: Secondary | ICD-10-CM | POA: Diagnosis not present

## 2019-03-05 DIAGNOSIS — L299 Pruritus, unspecified: Secondary | ICD-10-CM | POA: Diagnosis not present

## 2019-03-05 DIAGNOSIS — Z7984 Long term (current) use of oral hypoglycemic drugs: Secondary | ICD-10-CM | POA: Diagnosis not present

## 2019-03-05 DIAGNOSIS — I10 Essential (primary) hypertension: Secondary | ICD-10-CM | POA: Diagnosis not present

## 2019-03-05 DIAGNOSIS — D519 Vitamin B12 deficiency anemia, unspecified: Secondary | ICD-10-CM | POA: Diagnosis not present

## 2019-03-05 DIAGNOSIS — H9190 Unspecified hearing loss, unspecified ear: Secondary | ICD-10-CM | POA: Diagnosis not present

## 2019-03-05 DIAGNOSIS — R1312 Dysphagia, oropharyngeal phase: Secondary | ICD-10-CM | POA: Diagnosis not present

## 2019-03-06 DIAGNOSIS — E1122 Type 2 diabetes mellitus with diabetic chronic kidney disease: Secondary | ICD-10-CM | POA: Diagnosis not present

## 2019-03-06 DIAGNOSIS — D631 Anemia in chronic kidney disease: Secondary | ICD-10-CM | POA: Diagnosis not present

## 2019-03-06 DIAGNOSIS — N182 Chronic kidney disease, stage 2 (mild): Secondary | ICD-10-CM | POA: Diagnosis not present

## 2019-03-06 DIAGNOSIS — F329 Major depressive disorder, single episode, unspecified: Secondary | ICD-10-CM | POA: Diagnosis not present

## 2019-03-06 DIAGNOSIS — H548 Legal blindness, as defined in USA: Secondary | ICD-10-CM | POA: Diagnosis not present

## 2019-03-06 DIAGNOSIS — F419 Anxiety disorder, unspecified: Secondary | ICD-10-CM | POA: Diagnosis not present

## 2019-03-08 DIAGNOSIS — F064 Anxiety disorder due to known physiological condition: Secondary | ICD-10-CM | POA: Diagnosis not present

## 2019-03-08 DIAGNOSIS — F331 Major depressive disorder, recurrent, moderate: Secondary | ICD-10-CM | POA: Diagnosis not present

## 2019-03-09 DIAGNOSIS — F419 Anxiety disorder, unspecified: Secondary | ICD-10-CM | POA: Diagnosis not present

## 2019-03-09 DIAGNOSIS — H548 Legal blindness, as defined in USA: Secondary | ICD-10-CM | POA: Diagnosis not present

## 2019-03-09 DIAGNOSIS — E1122 Type 2 diabetes mellitus with diabetic chronic kidney disease: Secondary | ICD-10-CM | POA: Diagnosis not present

## 2019-03-09 DIAGNOSIS — D631 Anemia in chronic kidney disease: Secondary | ICD-10-CM | POA: Diagnosis not present

## 2019-03-09 DIAGNOSIS — N182 Chronic kidney disease, stage 2 (mild): Secondary | ICD-10-CM | POA: Diagnosis not present

## 2019-03-09 DIAGNOSIS — F329 Major depressive disorder, single episode, unspecified: Secondary | ICD-10-CM | POA: Diagnosis not present

## 2019-03-10 DIAGNOSIS — M4690 Unspecified inflammatory spondylopathy, site unspecified: Secondary | ICD-10-CM | POA: Diagnosis not present

## 2019-03-10 DIAGNOSIS — E0822 Diabetes mellitus due to underlying condition with diabetic chronic kidney disease: Secondary | ICD-10-CM | POA: Diagnosis not present

## 2019-03-10 DIAGNOSIS — I10 Essential (primary) hypertension: Secondary | ICD-10-CM | POA: Diagnosis not present

## 2019-03-10 DIAGNOSIS — R5381 Other malaise: Secondary | ICD-10-CM | POA: Diagnosis not present

## 2019-03-11 DIAGNOSIS — E1122 Type 2 diabetes mellitus with diabetic chronic kidney disease: Secondary | ICD-10-CM | POA: Diagnosis not present

## 2019-03-11 DIAGNOSIS — H548 Legal blindness, as defined in USA: Secondary | ICD-10-CM | POA: Diagnosis not present

## 2019-03-11 DIAGNOSIS — D631 Anemia in chronic kidney disease: Secondary | ICD-10-CM | POA: Diagnosis not present

## 2019-03-11 DIAGNOSIS — F419 Anxiety disorder, unspecified: Secondary | ICD-10-CM | POA: Diagnosis not present

## 2019-03-11 DIAGNOSIS — F329 Major depressive disorder, single episode, unspecified: Secondary | ICD-10-CM | POA: Diagnosis not present

## 2019-03-11 DIAGNOSIS — N182 Chronic kidney disease, stage 2 (mild): Secondary | ICD-10-CM | POA: Diagnosis not present

## 2019-03-15 DIAGNOSIS — D631 Anemia in chronic kidney disease: Secondary | ICD-10-CM | POA: Diagnosis not present

## 2019-03-15 DIAGNOSIS — N182 Chronic kidney disease, stage 2 (mild): Secondary | ICD-10-CM | POA: Diagnosis not present

## 2019-03-15 DIAGNOSIS — F419 Anxiety disorder, unspecified: Secondary | ICD-10-CM | POA: Diagnosis not present

## 2019-03-15 DIAGNOSIS — E1122 Type 2 diabetes mellitus with diabetic chronic kidney disease: Secondary | ICD-10-CM | POA: Diagnosis not present

## 2019-03-15 DIAGNOSIS — F329 Major depressive disorder, single episode, unspecified: Secondary | ICD-10-CM | POA: Diagnosis not present

## 2019-03-15 DIAGNOSIS — H548 Legal blindness, as defined in USA: Secondary | ICD-10-CM | POA: Diagnosis not present

## 2019-03-16 DIAGNOSIS — M79674 Pain in right toe(s): Secondary | ICD-10-CM | POA: Diagnosis not present

## 2019-03-16 DIAGNOSIS — M79675 Pain in left toe(s): Secondary | ICD-10-CM | POA: Diagnosis not present

## 2019-03-16 DIAGNOSIS — F331 Major depressive disorder, recurrent, moderate: Secondary | ICD-10-CM | POA: Diagnosis not present

## 2019-03-16 DIAGNOSIS — F064 Anxiety disorder due to known physiological condition: Secondary | ICD-10-CM | POA: Diagnosis not present

## 2019-03-16 DIAGNOSIS — B351 Tinea unguium: Secondary | ICD-10-CM | POA: Diagnosis not present

## 2019-03-17 DIAGNOSIS — F329 Major depressive disorder, single episode, unspecified: Secondary | ICD-10-CM | POA: Diagnosis not present

## 2019-03-17 DIAGNOSIS — H548 Legal blindness, as defined in USA: Secondary | ICD-10-CM | POA: Diagnosis not present

## 2019-03-17 DIAGNOSIS — F419 Anxiety disorder, unspecified: Secondary | ICD-10-CM | POA: Diagnosis not present

## 2019-03-17 DIAGNOSIS — D631 Anemia in chronic kidney disease: Secondary | ICD-10-CM | POA: Diagnosis not present

## 2019-03-17 DIAGNOSIS — E1122 Type 2 diabetes mellitus with diabetic chronic kidney disease: Secondary | ICD-10-CM | POA: Diagnosis not present

## 2019-03-17 DIAGNOSIS — N182 Chronic kidney disease, stage 2 (mild): Secondary | ICD-10-CM | POA: Diagnosis not present

## 2019-03-18 DIAGNOSIS — H548 Legal blindness, as defined in USA: Secondary | ICD-10-CM | POA: Diagnosis not present

## 2019-03-18 DIAGNOSIS — N182 Chronic kidney disease, stage 2 (mild): Secondary | ICD-10-CM | POA: Diagnosis not present

## 2019-03-18 DIAGNOSIS — F419 Anxiety disorder, unspecified: Secondary | ICD-10-CM | POA: Diagnosis not present

## 2019-03-18 DIAGNOSIS — E1122 Type 2 diabetes mellitus with diabetic chronic kidney disease: Secondary | ICD-10-CM | POA: Diagnosis not present

## 2019-03-18 DIAGNOSIS — F329 Major depressive disorder, single episode, unspecified: Secondary | ICD-10-CM | POA: Diagnosis not present

## 2019-03-18 DIAGNOSIS — D631 Anemia in chronic kidney disease: Secondary | ICD-10-CM | POA: Diagnosis not present

## 2019-03-22 DIAGNOSIS — D631 Anemia in chronic kidney disease: Secondary | ICD-10-CM | POA: Diagnosis not present

## 2019-03-22 DIAGNOSIS — F419 Anxiety disorder, unspecified: Secondary | ICD-10-CM | POA: Diagnosis not present

## 2019-03-22 DIAGNOSIS — F329 Major depressive disorder, single episode, unspecified: Secondary | ICD-10-CM | POA: Diagnosis not present

## 2019-03-22 DIAGNOSIS — H548 Legal blindness, as defined in USA: Secondary | ICD-10-CM | POA: Diagnosis not present

## 2019-03-22 DIAGNOSIS — N182 Chronic kidney disease, stage 2 (mild): Secondary | ICD-10-CM | POA: Diagnosis not present

## 2019-03-22 DIAGNOSIS — E1122 Type 2 diabetes mellitus with diabetic chronic kidney disease: Secondary | ICD-10-CM | POA: Diagnosis not present

## 2019-03-25 DIAGNOSIS — F329 Major depressive disorder, single episode, unspecified: Secondary | ICD-10-CM | POA: Diagnosis not present

## 2019-03-25 DIAGNOSIS — D631 Anemia in chronic kidney disease: Secondary | ICD-10-CM | POA: Diagnosis not present

## 2019-03-25 DIAGNOSIS — H548 Legal blindness, as defined in USA: Secondary | ICD-10-CM | POA: Diagnosis not present

## 2019-03-25 DIAGNOSIS — E1122 Type 2 diabetes mellitus with diabetic chronic kidney disease: Secondary | ICD-10-CM | POA: Diagnosis not present

## 2019-03-25 DIAGNOSIS — F419 Anxiety disorder, unspecified: Secondary | ICD-10-CM | POA: Diagnosis not present

## 2019-03-25 DIAGNOSIS — N182 Chronic kidney disease, stage 2 (mild): Secondary | ICD-10-CM | POA: Diagnosis not present

## 2019-03-26 DIAGNOSIS — N182 Chronic kidney disease, stage 2 (mild): Secondary | ICD-10-CM | POA: Diagnosis not present

## 2019-03-26 DIAGNOSIS — F329 Major depressive disorder, single episode, unspecified: Secondary | ICD-10-CM | POA: Diagnosis not present

## 2019-03-26 DIAGNOSIS — D631 Anemia in chronic kidney disease: Secondary | ICD-10-CM | POA: Diagnosis not present

## 2019-03-26 DIAGNOSIS — E1122 Type 2 diabetes mellitus with diabetic chronic kidney disease: Secondary | ICD-10-CM | POA: Diagnosis not present

## 2019-03-26 DIAGNOSIS — F419 Anxiety disorder, unspecified: Secondary | ICD-10-CM | POA: Diagnosis not present

## 2019-03-26 DIAGNOSIS — H548 Legal blindness, as defined in USA: Secondary | ICD-10-CM | POA: Diagnosis not present

## 2019-03-30 DIAGNOSIS — E1122 Type 2 diabetes mellitus with diabetic chronic kidney disease: Secondary | ICD-10-CM | POA: Diagnosis not present

## 2019-03-30 DIAGNOSIS — F419 Anxiety disorder, unspecified: Secondary | ICD-10-CM | POA: Diagnosis not present

## 2019-03-30 DIAGNOSIS — D631 Anemia in chronic kidney disease: Secondary | ICD-10-CM | POA: Diagnosis not present

## 2019-03-30 DIAGNOSIS — H548 Legal blindness, as defined in USA: Secondary | ICD-10-CM | POA: Diagnosis not present

## 2019-03-30 DIAGNOSIS — F329 Major depressive disorder, single episode, unspecified: Secondary | ICD-10-CM | POA: Diagnosis not present

## 2019-03-30 DIAGNOSIS — N182 Chronic kidney disease, stage 2 (mild): Secondary | ICD-10-CM | POA: Diagnosis not present

## 2019-04-03 DIAGNOSIS — N182 Chronic kidney disease, stage 2 (mild): Secondary | ICD-10-CM | POA: Diagnosis not present

## 2019-04-03 DIAGNOSIS — D631 Anemia in chronic kidney disease: Secondary | ICD-10-CM | POA: Diagnosis not present

## 2019-04-03 DIAGNOSIS — F419 Anxiety disorder, unspecified: Secondary | ICD-10-CM | POA: Diagnosis not present

## 2019-04-03 DIAGNOSIS — E1122 Type 2 diabetes mellitus with diabetic chronic kidney disease: Secondary | ICD-10-CM | POA: Diagnosis not present

## 2019-04-03 DIAGNOSIS — H548 Legal blindness, as defined in USA: Secondary | ICD-10-CM | POA: Diagnosis not present

## 2019-04-03 DIAGNOSIS — F329 Major depressive disorder, single episode, unspecified: Secondary | ICD-10-CM | POA: Diagnosis not present

## 2019-04-04 DIAGNOSIS — N182 Chronic kidney disease, stage 2 (mild): Secondary | ICD-10-CM | POA: Diagnosis not present

## 2019-04-04 DIAGNOSIS — R1312 Dysphagia, oropharyngeal phase: Secondary | ICD-10-CM | POA: Diagnosis not present

## 2019-04-04 DIAGNOSIS — E519 Thiamine deficiency, unspecified: Secondary | ICD-10-CM | POA: Diagnosis not present

## 2019-04-04 DIAGNOSIS — M4690 Unspecified inflammatory spondylopathy, site unspecified: Secondary | ICD-10-CM | POA: Diagnosis not present

## 2019-04-04 DIAGNOSIS — D631 Anemia in chronic kidney disease: Secondary | ICD-10-CM | POA: Diagnosis not present

## 2019-04-04 DIAGNOSIS — E1122 Type 2 diabetes mellitus with diabetic chronic kidney disease: Secondary | ICD-10-CM | POA: Diagnosis not present

## 2019-04-04 DIAGNOSIS — Z981 Arthrodesis status: Secondary | ICD-10-CM | POA: Diagnosis not present

## 2019-04-04 DIAGNOSIS — K5900 Constipation, unspecified: Secondary | ICD-10-CM | POA: Diagnosis not present

## 2019-04-04 DIAGNOSIS — D519 Vitamin B12 deficiency anemia, unspecified: Secondary | ICD-10-CM | POA: Diagnosis not present

## 2019-04-04 DIAGNOSIS — F419 Anxiety disorder, unspecified: Secondary | ICD-10-CM | POA: Diagnosis not present

## 2019-04-04 DIAGNOSIS — H3530 Unspecified macular degeneration: Secondary | ICD-10-CM | POA: Diagnosis not present

## 2019-04-04 DIAGNOSIS — I10 Essential (primary) hypertension: Secondary | ICD-10-CM | POA: Diagnosis not present

## 2019-04-04 DIAGNOSIS — Z9181 History of falling: Secondary | ICD-10-CM | POA: Diagnosis not present

## 2019-04-04 DIAGNOSIS — Z7984 Long term (current) use of oral hypoglycemic drugs: Secondary | ICD-10-CM | POA: Diagnosis not present

## 2019-04-04 DIAGNOSIS — F329 Major depressive disorder, single episode, unspecified: Secondary | ICD-10-CM | POA: Diagnosis not present

## 2019-04-04 DIAGNOSIS — L299 Pruritus, unspecified: Secondary | ICD-10-CM | POA: Diagnosis not present

## 2019-04-04 DIAGNOSIS — H548 Legal blindness, as defined in USA: Secondary | ICD-10-CM | POA: Diagnosis not present

## 2019-04-04 DIAGNOSIS — H9190 Unspecified hearing loss, unspecified ear: Secondary | ICD-10-CM | POA: Diagnosis not present

## 2019-04-05 DIAGNOSIS — F064 Anxiety disorder due to known physiological condition: Secondary | ICD-10-CM | POA: Diagnosis not present

## 2019-04-05 DIAGNOSIS — F331 Major depressive disorder, recurrent, moderate: Secondary | ICD-10-CM | POA: Diagnosis not present

## 2019-04-06 DIAGNOSIS — D631 Anemia in chronic kidney disease: Secondary | ICD-10-CM | POA: Diagnosis not present

## 2019-04-06 DIAGNOSIS — H548 Legal blindness, as defined in USA: Secondary | ICD-10-CM | POA: Diagnosis not present

## 2019-04-06 DIAGNOSIS — E1122 Type 2 diabetes mellitus with diabetic chronic kidney disease: Secondary | ICD-10-CM | POA: Diagnosis not present

## 2019-04-06 DIAGNOSIS — F329 Major depressive disorder, single episode, unspecified: Secondary | ICD-10-CM | POA: Diagnosis not present

## 2019-04-06 DIAGNOSIS — F419 Anxiety disorder, unspecified: Secondary | ICD-10-CM | POA: Diagnosis not present

## 2019-04-06 DIAGNOSIS — N182 Chronic kidney disease, stage 2 (mild): Secondary | ICD-10-CM | POA: Diagnosis not present

## 2019-04-08 DIAGNOSIS — N182 Chronic kidney disease, stage 2 (mild): Secondary | ICD-10-CM | POA: Diagnosis not present

## 2019-04-08 DIAGNOSIS — D631 Anemia in chronic kidney disease: Secondary | ICD-10-CM | POA: Diagnosis not present

## 2019-04-08 DIAGNOSIS — E1122 Type 2 diabetes mellitus with diabetic chronic kidney disease: Secondary | ICD-10-CM | POA: Diagnosis not present

## 2019-04-08 DIAGNOSIS — H548 Legal blindness, as defined in USA: Secondary | ICD-10-CM | POA: Diagnosis not present

## 2019-04-08 DIAGNOSIS — F329 Major depressive disorder, single episode, unspecified: Secondary | ICD-10-CM | POA: Diagnosis not present

## 2019-04-08 DIAGNOSIS — F419 Anxiety disorder, unspecified: Secondary | ICD-10-CM | POA: Diagnosis not present

## 2019-04-13 DIAGNOSIS — H548 Legal blindness, as defined in USA: Secondary | ICD-10-CM | POA: Diagnosis not present

## 2019-04-13 DIAGNOSIS — F329 Major depressive disorder, single episode, unspecified: Secondary | ICD-10-CM | POA: Diagnosis not present

## 2019-04-13 DIAGNOSIS — D631 Anemia in chronic kidney disease: Secondary | ICD-10-CM | POA: Diagnosis not present

## 2019-04-13 DIAGNOSIS — F419 Anxiety disorder, unspecified: Secondary | ICD-10-CM | POA: Diagnosis not present

## 2019-04-13 DIAGNOSIS — E1122 Type 2 diabetes mellitus with diabetic chronic kidney disease: Secondary | ICD-10-CM | POA: Diagnosis not present

## 2019-04-13 DIAGNOSIS — N182 Chronic kidney disease, stage 2 (mild): Secondary | ICD-10-CM | POA: Diagnosis not present

## 2019-04-19 DIAGNOSIS — F064 Anxiety disorder due to known physiological condition: Secondary | ICD-10-CM | POA: Diagnosis not present

## 2019-04-19 DIAGNOSIS — F331 Major depressive disorder, recurrent, moderate: Secondary | ICD-10-CM | POA: Diagnosis not present

## 2019-04-20 DIAGNOSIS — F329 Major depressive disorder, single episode, unspecified: Secondary | ICD-10-CM | POA: Diagnosis not present

## 2019-04-20 DIAGNOSIS — D631 Anemia in chronic kidney disease: Secondary | ICD-10-CM | POA: Diagnosis not present

## 2019-04-20 DIAGNOSIS — F419 Anxiety disorder, unspecified: Secondary | ICD-10-CM | POA: Diagnosis not present

## 2019-04-20 DIAGNOSIS — E1122 Type 2 diabetes mellitus with diabetic chronic kidney disease: Secondary | ICD-10-CM | POA: Diagnosis not present

## 2019-04-20 DIAGNOSIS — N182 Chronic kidney disease, stage 2 (mild): Secondary | ICD-10-CM | POA: Diagnosis not present

## 2019-04-20 DIAGNOSIS — F064 Anxiety disorder due to known physiological condition: Secondary | ICD-10-CM | POA: Diagnosis not present

## 2019-04-20 DIAGNOSIS — H548 Legal blindness, as defined in USA: Secondary | ICD-10-CM | POA: Diagnosis not present

## 2019-04-20 DIAGNOSIS — F331 Major depressive disorder, recurrent, moderate: Secondary | ICD-10-CM | POA: Diagnosis not present

## 2019-04-26 DIAGNOSIS — R279 Unspecified lack of coordination: Secondary | ICD-10-CM | POA: Diagnosis not present

## 2019-04-26 DIAGNOSIS — M79602 Pain in left arm: Secondary | ICD-10-CM | POA: Diagnosis not present

## 2019-04-26 DIAGNOSIS — R2689 Other abnormalities of gait and mobility: Secondary | ICD-10-CM | POA: Diagnosis not present

## 2019-04-26 DIAGNOSIS — E1169 Type 2 diabetes mellitus with other specified complication: Secondary | ICD-10-CM | POA: Diagnosis not present

## 2019-04-26 DIAGNOSIS — M79601 Pain in right arm: Secondary | ICD-10-CM | POA: Diagnosis not present

## 2019-04-29 DIAGNOSIS — R279 Unspecified lack of coordination: Secondary | ICD-10-CM | POA: Diagnosis not present

## 2019-04-29 DIAGNOSIS — M79601 Pain in right arm: Secondary | ICD-10-CM | POA: Diagnosis not present

## 2019-04-29 DIAGNOSIS — E1169 Type 2 diabetes mellitus with other specified complication: Secondary | ICD-10-CM | POA: Diagnosis not present

## 2019-04-29 DIAGNOSIS — M79602 Pain in left arm: Secondary | ICD-10-CM | POA: Diagnosis not present

## 2019-04-29 DIAGNOSIS — R2689 Other abnormalities of gait and mobility: Secondary | ICD-10-CM | POA: Diagnosis not present

## 2019-05-03 DIAGNOSIS — E1169 Type 2 diabetes mellitus with other specified complication: Secondary | ICD-10-CM | POA: Diagnosis not present

## 2019-05-03 DIAGNOSIS — R2689 Other abnormalities of gait and mobility: Secondary | ICD-10-CM | POA: Diagnosis not present

## 2019-05-03 DIAGNOSIS — R279 Unspecified lack of coordination: Secondary | ICD-10-CM | POA: Diagnosis not present

## 2019-05-03 DIAGNOSIS — M79601 Pain in right arm: Secondary | ICD-10-CM | POA: Diagnosis not present

## 2019-05-03 DIAGNOSIS — M79602 Pain in left arm: Secondary | ICD-10-CM | POA: Diagnosis not present

## 2019-05-04 DIAGNOSIS — F331 Major depressive disorder, recurrent, moderate: Secondary | ICD-10-CM | POA: Diagnosis not present

## 2019-05-04 DIAGNOSIS — E1169 Type 2 diabetes mellitus with other specified complication: Secondary | ICD-10-CM | POA: Diagnosis not present

## 2019-05-04 DIAGNOSIS — R279 Unspecified lack of coordination: Secondary | ICD-10-CM | POA: Diagnosis not present

## 2019-05-04 DIAGNOSIS — F064 Anxiety disorder due to known physiological condition: Secondary | ICD-10-CM | POA: Diagnosis not present

## 2019-05-04 DIAGNOSIS — R2689 Other abnormalities of gait and mobility: Secondary | ICD-10-CM | POA: Diagnosis not present

## 2019-05-04 DIAGNOSIS — M79602 Pain in left arm: Secondary | ICD-10-CM | POA: Diagnosis not present

## 2019-05-04 DIAGNOSIS — M79601 Pain in right arm: Secondary | ICD-10-CM | POA: Diagnosis not present

## 2019-05-05 DIAGNOSIS — M79602 Pain in left arm: Secondary | ICD-10-CM | POA: Diagnosis not present

## 2019-05-05 DIAGNOSIS — M79601 Pain in right arm: Secondary | ICD-10-CM | POA: Diagnosis not present

## 2019-05-05 DIAGNOSIS — E1169 Type 2 diabetes mellitus with other specified complication: Secondary | ICD-10-CM | POA: Diagnosis not present

## 2019-05-05 DIAGNOSIS — R2689 Other abnormalities of gait and mobility: Secondary | ICD-10-CM | POA: Diagnosis not present

## 2019-05-05 DIAGNOSIS — R279 Unspecified lack of coordination: Secondary | ICD-10-CM | POA: Diagnosis not present

## 2019-05-06 DIAGNOSIS — R279 Unspecified lack of coordination: Secondary | ICD-10-CM | POA: Diagnosis not present

## 2019-05-06 DIAGNOSIS — M79602 Pain in left arm: Secondary | ICD-10-CM | POA: Diagnosis not present

## 2019-05-06 DIAGNOSIS — E1169 Type 2 diabetes mellitus with other specified complication: Secondary | ICD-10-CM | POA: Diagnosis not present

## 2019-05-06 DIAGNOSIS — M79601 Pain in right arm: Secondary | ICD-10-CM | POA: Diagnosis not present

## 2019-05-06 DIAGNOSIS — R2689 Other abnormalities of gait and mobility: Secondary | ICD-10-CM | POA: Diagnosis not present

## 2019-05-07 DIAGNOSIS — R279 Unspecified lack of coordination: Secondary | ICD-10-CM | POA: Diagnosis not present

## 2019-05-07 DIAGNOSIS — E1169 Type 2 diabetes mellitus with other specified complication: Secondary | ICD-10-CM | POA: Diagnosis not present

## 2019-05-07 DIAGNOSIS — R2689 Other abnormalities of gait and mobility: Secondary | ICD-10-CM | POA: Diagnosis not present

## 2019-05-07 DIAGNOSIS — M79602 Pain in left arm: Secondary | ICD-10-CM | POA: Diagnosis not present

## 2019-05-07 DIAGNOSIS — M79601 Pain in right arm: Secondary | ICD-10-CM | POA: Diagnosis not present

## 2019-05-10 DIAGNOSIS — M79601 Pain in right arm: Secondary | ICD-10-CM | POA: Diagnosis not present

## 2019-05-10 DIAGNOSIS — R2689 Other abnormalities of gait and mobility: Secondary | ICD-10-CM | POA: Diagnosis not present

## 2019-05-10 DIAGNOSIS — E1169 Type 2 diabetes mellitus with other specified complication: Secondary | ICD-10-CM | POA: Diagnosis not present

## 2019-05-10 DIAGNOSIS — R279 Unspecified lack of coordination: Secondary | ICD-10-CM | POA: Diagnosis not present

## 2019-05-10 DIAGNOSIS — M79602 Pain in left arm: Secondary | ICD-10-CM | POA: Diagnosis not present

## 2019-05-11 DIAGNOSIS — E1169 Type 2 diabetes mellitus with other specified complication: Secondary | ICD-10-CM | POA: Diagnosis not present

## 2019-05-11 DIAGNOSIS — R2689 Other abnormalities of gait and mobility: Secondary | ICD-10-CM | POA: Diagnosis not present

## 2019-05-11 DIAGNOSIS — M79601 Pain in right arm: Secondary | ICD-10-CM | POA: Diagnosis not present

## 2019-05-11 DIAGNOSIS — R279 Unspecified lack of coordination: Secondary | ICD-10-CM | POA: Diagnosis not present

## 2019-05-11 DIAGNOSIS — M79602 Pain in left arm: Secondary | ICD-10-CM | POA: Diagnosis not present

## 2019-05-12 DIAGNOSIS — R2689 Other abnormalities of gait and mobility: Secondary | ICD-10-CM | POA: Diagnosis not present

## 2019-05-12 DIAGNOSIS — M79602 Pain in left arm: Secondary | ICD-10-CM | POA: Diagnosis not present

## 2019-05-12 DIAGNOSIS — M79601 Pain in right arm: Secondary | ICD-10-CM | POA: Diagnosis not present

## 2019-05-12 DIAGNOSIS — E1169 Type 2 diabetes mellitus with other specified complication: Secondary | ICD-10-CM | POA: Diagnosis not present

## 2019-05-12 DIAGNOSIS — R279 Unspecified lack of coordination: Secondary | ICD-10-CM | POA: Diagnosis not present

## 2019-05-13 DIAGNOSIS — M79602 Pain in left arm: Secondary | ICD-10-CM | POA: Diagnosis not present

## 2019-05-13 DIAGNOSIS — E1169 Type 2 diabetes mellitus with other specified complication: Secondary | ICD-10-CM | POA: Diagnosis not present

## 2019-05-13 DIAGNOSIS — R2689 Other abnormalities of gait and mobility: Secondary | ICD-10-CM | POA: Diagnosis not present

## 2019-05-13 DIAGNOSIS — R279 Unspecified lack of coordination: Secondary | ICD-10-CM | POA: Diagnosis not present

## 2019-05-13 DIAGNOSIS — M79601 Pain in right arm: Secondary | ICD-10-CM | POA: Diagnosis not present

## 2019-05-14 DIAGNOSIS — M79601 Pain in right arm: Secondary | ICD-10-CM | POA: Diagnosis not present

## 2019-05-14 DIAGNOSIS — E1169 Type 2 diabetes mellitus with other specified complication: Secondary | ICD-10-CM | POA: Diagnosis not present

## 2019-05-14 DIAGNOSIS — M79602 Pain in left arm: Secondary | ICD-10-CM | POA: Diagnosis not present

## 2019-05-14 DIAGNOSIS — R2689 Other abnormalities of gait and mobility: Secondary | ICD-10-CM | POA: Diagnosis not present

## 2019-05-14 DIAGNOSIS — R279 Unspecified lack of coordination: Secondary | ICD-10-CM | POA: Diagnosis not present

## 2019-05-17 DIAGNOSIS — I10 Essential (primary) hypertension: Secondary | ICD-10-CM | POA: Diagnosis not present

## 2019-05-17 DIAGNOSIS — R1312 Dysphagia, oropharyngeal phase: Secondary | ICD-10-CM | POA: Diagnosis not present

## 2019-05-17 DIAGNOSIS — M79602 Pain in left arm: Secondary | ICD-10-CM | POA: Diagnosis not present

## 2019-05-17 DIAGNOSIS — K5900 Constipation, unspecified: Secondary | ICD-10-CM | POA: Diagnosis not present

## 2019-05-17 DIAGNOSIS — E0822 Diabetes mellitus due to underlying condition with diabetic chronic kidney disease: Secondary | ICD-10-CM | POA: Diagnosis not present

## 2019-05-17 DIAGNOSIS — R279 Unspecified lack of coordination: Secondary | ICD-10-CM | POA: Diagnosis not present

## 2019-05-17 DIAGNOSIS — E1169 Type 2 diabetes mellitus with other specified complication: Secondary | ICD-10-CM | POA: Diagnosis not present

## 2019-05-17 DIAGNOSIS — M79601 Pain in right arm: Secondary | ICD-10-CM | POA: Diagnosis not present

## 2019-05-17 DIAGNOSIS — R2689 Other abnormalities of gait and mobility: Secondary | ICD-10-CM | POA: Diagnosis not present

## 2019-05-18 DIAGNOSIS — E1169 Type 2 diabetes mellitus with other specified complication: Secondary | ICD-10-CM | POA: Diagnosis not present

## 2019-05-18 DIAGNOSIS — R2689 Other abnormalities of gait and mobility: Secondary | ICD-10-CM | POA: Diagnosis not present

## 2019-05-18 DIAGNOSIS — M79602 Pain in left arm: Secondary | ICD-10-CM | POA: Diagnosis not present

## 2019-05-18 DIAGNOSIS — M79601 Pain in right arm: Secondary | ICD-10-CM | POA: Diagnosis not present

## 2019-05-18 DIAGNOSIS — R279 Unspecified lack of coordination: Secondary | ICD-10-CM | POA: Diagnosis not present

## 2019-05-19 DIAGNOSIS — E1169 Type 2 diabetes mellitus with other specified complication: Secondary | ICD-10-CM | POA: Diagnosis not present

## 2019-05-19 DIAGNOSIS — R2689 Other abnormalities of gait and mobility: Secondary | ICD-10-CM | POA: Diagnosis not present

## 2019-05-19 DIAGNOSIS — R279 Unspecified lack of coordination: Secondary | ICD-10-CM | POA: Diagnosis not present

## 2019-05-19 DIAGNOSIS — M79602 Pain in left arm: Secondary | ICD-10-CM | POA: Diagnosis not present

## 2019-05-19 DIAGNOSIS — M79601 Pain in right arm: Secondary | ICD-10-CM | POA: Diagnosis not present

## 2019-05-20 DIAGNOSIS — M79602 Pain in left arm: Secondary | ICD-10-CM | POA: Diagnosis not present

## 2019-05-20 DIAGNOSIS — F064 Anxiety disorder due to known physiological condition: Secondary | ICD-10-CM | POA: Diagnosis not present

## 2019-05-20 DIAGNOSIS — E1169 Type 2 diabetes mellitus with other specified complication: Secondary | ICD-10-CM | POA: Diagnosis not present

## 2019-05-20 DIAGNOSIS — R2689 Other abnormalities of gait and mobility: Secondary | ICD-10-CM | POA: Diagnosis not present

## 2019-05-20 DIAGNOSIS — F331 Major depressive disorder, recurrent, moderate: Secondary | ICD-10-CM | POA: Diagnosis not present

## 2019-05-20 DIAGNOSIS — R279 Unspecified lack of coordination: Secondary | ICD-10-CM | POA: Diagnosis not present

## 2019-05-20 DIAGNOSIS — M79601 Pain in right arm: Secondary | ICD-10-CM | POA: Diagnosis not present

## 2019-05-26 DIAGNOSIS — M79602 Pain in left arm: Secondary | ICD-10-CM | POA: Diagnosis not present

## 2019-05-26 DIAGNOSIS — M79601 Pain in right arm: Secondary | ICD-10-CM | POA: Diagnosis not present

## 2019-05-26 DIAGNOSIS — R279 Unspecified lack of coordination: Secondary | ICD-10-CM | POA: Diagnosis not present

## 2019-05-26 DIAGNOSIS — R2689 Other abnormalities of gait and mobility: Secondary | ICD-10-CM | POA: Diagnosis not present

## 2019-05-26 DIAGNOSIS — E1169 Type 2 diabetes mellitus with other specified complication: Secondary | ICD-10-CM | POA: Diagnosis not present

## 2019-05-27 DIAGNOSIS — E1169 Type 2 diabetes mellitus with other specified complication: Secondary | ICD-10-CM | POA: Diagnosis not present

## 2019-05-27 DIAGNOSIS — R279 Unspecified lack of coordination: Secondary | ICD-10-CM | POA: Diagnosis not present

## 2019-05-27 DIAGNOSIS — M79602 Pain in left arm: Secondary | ICD-10-CM | POA: Diagnosis not present

## 2019-05-27 DIAGNOSIS — R2689 Other abnormalities of gait and mobility: Secondary | ICD-10-CM | POA: Diagnosis not present

## 2019-05-27 DIAGNOSIS — M79601 Pain in right arm: Secondary | ICD-10-CM | POA: Diagnosis not present

## 2019-05-31 DIAGNOSIS — M79602 Pain in left arm: Secondary | ICD-10-CM | POA: Diagnosis not present

## 2019-05-31 DIAGNOSIS — R2689 Other abnormalities of gait and mobility: Secondary | ICD-10-CM | POA: Diagnosis not present

## 2019-05-31 DIAGNOSIS — M79601 Pain in right arm: Secondary | ICD-10-CM | POA: Diagnosis not present

## 2019-05-31 DIAGNOSIS — R279 Unspecified lack of coordination: Secondary | ICD-10-CM | POA: Diagnosis not present

## 2019-05-31 DIAGNOSIS — E1169 Type 2 diabetes mellitus with other specified complication: Secondary | ICD-10-CM | POA: Diagnosis not present

## 2019-06-01 DIAGNOSIS — M79601 Pain in right arm: Secondary | ICD-10-CM | POA: Diagnosis not present

## 2019-06-01 DIAGNOSIS — F064 Anxiety disorder due to known physiological condition: Secondary | ICD-10-CM | POA: Diagnosis not present

## 2019-06-01 DIAGNOSIS — R2689 Other abnormalities of gait and mobility: Secondary | ICD-10-CM | POA: Diagnosis not present

## 2019-06-01 DIAGNOSIS — R279 Unspecified lack of coordination: Secondary | ICD-10-CM | POA: Diagnosis not present

## 2019-06-01 DIAGNOSIS — E1169 Type 2 diabetes mellitus with other specified complication: Secondary | ICD-10-CM | POA: Diagnosis not present

## 2019-06-01 DIAGNOSIS — M79602 Pain in left arm: Secondary | ICD-10-CM | POA: Diagnosis not present

## 2019-06-01 DIAGNOSIS — F331 Major depressive disorder, recurrent, moderate: Secondary | ICD-10-CM | POA: Diagnosis not present

## 2019-06-02 DIAGNOSIS — R279 Unspecified lack of coordination: Secondary | ICD-10-CM | POA: Diagnosis not present

## 2019-06-02 DIAGNOSIS — M79601 Pain in right arm: Secondary | ICD-10-CM | POA: Diagnosis not present

## 2019-06-02 DIAGNOSIS — M79602 Pain in left arm: Secondary | ICD-10-CM | POA: Diagnosis not present

## 2019-06-02 DIAGNOSIS — R2689 Other abnormalities of gait and mobility: Secondary | ICD-10-CM | POA: Diagnosis not present

## 2019-06-02 DIAGNOSIS — E1169 Type 2 diabetes mellitus with other specified complication: Secondary | ICD-10-CM | POA: Diagnosis not present

## 2019-06-07 DIAGNOSIS — R2689 Other abnormalities of gait and mobility: Secondary | ICD-10-CM | POA: Diagnosis not present

## 2019-06-07 DIAGNOSIS — M79602 Pain in left arm: Secondary | ICD-10-CM | POA: Diagnosis not present

## 2019-06-07 DIAGNOSIS — E1169 Type 2 diabetes mellitus with other specified complication: Secondary | ICD-10-CM | POA: Diagnosis not present

## 2019-06-07 DIAGNOSIS — R279 Unspecified lack of coordination: Secondary | ICD-10-CM | POA: Diagnosis not present

## 2019-06-07 DIAGNOSIS — M79601 Pain in right arm: Secondary | ICD-10-CM | POA: Diagnosis not present

## 2019-06-08 DIAGNOSIS — E1169 Type 2 diabetes mellitus with other specified complication: Secondary | ICD-10-CM | POA: Diagnosis not present

## 2019-06-08 DIAGNOSIS — R279 Unspecified lack of coordination: Secondary | ICD-10-CM | POA: Diagnosis not present

## 2019-06-08 DIAGNOSIS — M79602 Pain in left arm: Secondary | ICD-10-CM | POA: Diagnosis not present

## 2019-06-08 DIAGNOSIS — R2689 Other abnormalities of gait and mobility: Secondary | ICD-10-CM | POA: Diagnosis not present

## 2019-06-08 DIAGNOSIS — M79601 Pain in right arm: Secondary | ICD-10-CM | POA: Diagnosis not present

## 2019-06-09 DIAGNOSIS — M79601 Pain in right arm: Secondary | ICD-10-CM | POA: Diagnosis not present

## 2019-06-09 DIAGNOSIS — R2689 Other abnormalities of gait and mobility: Secondary | ICD-10-CM | POA: Diagnosis not present

## 2019-06-09 DIAGNOSIS — R279 Unspecified lack of coordination: Secondary | ICD-10-CM | POA: Diagnosis not present

## 2019-06-09 DIAGNOSIS — M79602 Pain in left arm: Secondary | ICD-10-CM | POA: Diagnosis not present

## 2019-06-09 DIAGNOSIS — E1169 Type 2 diabetes mellitus with other specified complication: Secondary | ICD-10-CM | POA: Diagnosis not present

## 2019-06-14 DIAGNOSIS — E0822 Diabetes mellitus due to underlying condition with diabetic chronic kidney disease: Secondary | ICD-10-CM | POA: Diagnosis not present

## 2019-06-14 DIAGNOSIS — I10 Essential (primary) hypertension: Secondary | ICD-10-CM | POA: Diagnosis not present

## 2019-06-14 DIAGNOSIS — K5900 Constipation, unspecified: Secondary | ICD-10-CM | POA: Diagnosis not present

## 2019-06-14 DIAGNOSIS — R1312 Dysphagia, oropharyngeal phase: Secondary | ICD-10-CM | POA: Diagnosis not present

## 2019-06-15 DIAGNOSIS — M79601 Pain in right arm: Secondary | ICD-10-CM | POA: Diagnosis not present

## 2019-06-15 DIAGNOSIS — R2689 Other abnormalities of gait and mobility: Secondary | ICD-10-CM | POA: Diagnosis not present

## 2019-06-15 DIAGNOSIS — M79602 Pain in left arm: Secondary | ICD-10-CM | POA: Diagnosis not present

## 2019-06-15 DIAGNOSIS — R279 Unspecified lack of coordination: Secondary | ICD-10-CM | POA: Diagnosis not present

## 2019-06-15 DIAGNOSIS — E1169 Type 2 diabetes mellitus with other specified complication: Secondary | ICD-10-CM | POA: Diagnosis not present

## 2019-06-16 DIAGNOSIS — R279 Unspecified lack of coordination: Secondary | ICD-10-CM | POA: Diagnosis not present

## 2019-06-16 DIAGNOSIS — E1169 Type 2 diabetes mellitus with other specified complication: Secondary | ICD-10-CM | POA: Diagnosis not present

## 2019-06-16 DIAGNOSIS — F064 Anxiety disorder due to known physiological condition: Secondary | ICD-10-CM | POA: Diagnosis not present

## 2019-06-16 DIAGNOSIS — F331 Major depressive disorder, recurrent, moderate: Secondary | ICD-10-CM | POA: Diagnosis not present

## 2019-06-16 DIAGNOSIS — M79601 Pain in right arm: Secondary | ICD-10-CM | POA: Diagnosis not present

## 2019-06-16 DIAGNOSIS — M79602 Pain in left arm: Secondary | ICD-10-CM | POA: Diagnosis not present

## 2019-06-16 DIAGNOSIS — R2689 Other abnormalities of gait and mobility: Secondary | ICD-10-CM | POA: Diagnosis not present

## 2019-06-18 DIAGNOSIS — E1169 Type 2 diabetes mellitus with other specified complication: Secondary | ICD-10-CM | POA: Diagnosis not present

## 2019-06-18 DIAGNOSIS — R279 Unspecified lack of coordination: Secondary | ICD-10-CM | POA: Diagnosis not present

## 2019-06-18 DIAGNOSIS — M79602 Pain in left arm: Secondary | ICD-10-CM | POA: Diagnosis not present

## 2019-06-18 DIAGNOSIS — R2689 Other abnormalities of gait and mobility: Secondary | ICD-10-CM | POA: Diagnosis not present

## 2019-06-18 DIAGNOSIS — M79601 Pain in right arm: Secondary | ICD-10-CM | POA: Diagnosis not present

## 2019-06-23 DIAGNOSIS — F418 Other specified anxiety disorders: Secondary | ICD-10-CM | POA: Diagnosis not present

## 2019-06-23 DIAGNOSIS — R5381 Other malaise: Secondary | ICD-10-CM | POA: Diagnosis not present

## 2019-06-23 DIAGNOSIS — F331 Major depressive disorder, recurrent, moderate: Secondary | ICD-10-CM | POA: Diagnosis not present

## 2019-06-24 DIAGNOSIS — D649 Anemia, unspecified: Secondary | ICD-10-CM | POA: Diagnosis not present

## 2019-06-29 DIAGNOSIS — F331 Major depressive disorder, recurrent, moderate: Secondary | ICD-10-CM | POA: Diagnosis not present

## 2019-06-29 DIAGNOSIS — F064 Anxiety disorder due to known physiological condition: Secondary | ICD-10-CM | POA: Diagnosis not present

## 2019-06-30 DIAGNOSIS — B354 Tinea corporis: Secondary | ICD-10-CM | POA: Diagnosis not present

## 2019-07-12 DIAGNOSIS — I10 Essential (primary) hypertension: Secondary | ICD-10-CM | POA: Diagnosis not present

## 2019-07-12 DIAGNOSIS — R1312 Dysphagia, oropharyngeal phase: Secondary | ICD-10-CM | POA: Diagnosis not present

## 2019-07-12 DIAGNOSIS — N182 Chronic kidney disease, stage 2 (mild): Secondary | ICD-10-CM | POA: Diagnosis not present

## 2019-07-12 DIAGNOSIS — F331 Major depressive disorder, recurrent, moderate: Secondary | ICD-10-CM | POA: Diagnosis not present

## 2019-07-12 DIAGNOSIS — D6489 Other specified anemias: Secondary | ICD-10-CM | POA: Diagnosis not present

## 2019-07-12 DIAGNOSIS — K5909 Other constipation: Secondary | ICD-10-CM | POA: Diagnosis not present

## 2019-07-12 DIAGNOSIS — E0822 Diabetes mellitus due to underlying condition with diabetic chronic kidney disease: Secondary | ICD-10-CM | POA: Diagnosis not present

## 2019-07-12 DIAGNOSIS — F418 Other specified anxiety disorders: Secondary | ICD-10-CM | POA: Diagnosis not present

## 2019-07-13 DIAGNOSIS — F064 Anxiety disorder due to known physiological condition: Secondary | ICD-10-CM | POA: Diagnosis not present

## 2019-07-13 DIAGNOSIS — F331 Major depressive disorder, recurrent, moderate: Secondary | ICD-10-CM | POA: Diagnosis not present

## 2019-07-24 DIAGNOSIS — N390 Urinary tract infection, site not specified: Secondary | ICD-10-CM | POA: Diagnosis not present

## 2019-07-24 DIAGNOSIS — R300 Dysuria: Secondary | ICD-10-CM | POA: Diagnosis not present

## 2019-07-26 DIAGNOSIS — I10 Essential (primary) hypertension: Secondary | ICD-10-CM | POA: Diagnosis not present

## 2019-07-26 DIAGNOSIS — M19041 Primary osteoarthritis, right hand: Secondary | ICD-10-CM | POA: Diagnosis not present

## 2019-07-26 DIAGNOSIS — D649 Anemia, unspecified: Secondary | ICD-10-CM | POA: Diagnosis not present

## 2019-07-28 DIAGNOSIS — M4689 Other specified inflammatory spondylopathies, multiple sites in spine: Secondary | ICD-10-CM | POA: Diagnosis not present

## 2019-07-28 DIAGNOSIS — F331 Major depressive disorder, recurrent, moderate: Secondary | ICD-10-CM | POA: Diagnosis not present

## 2019-07-28 DIAGNOSIS — R5381 Other malaise: Secondary | ICD-10-CM | POA: Diagnosis not present

## 2019-07-30 DIAGNOSIS — M4326 Fusion of spine, lumbar region: Secondary | ICD-10-CM | POA: Diagnosis not present

## 2019-07-30 DIAGNOSIS — L299 Pruritus, unspecified: Secondary | ICD-10-CM | POA: Diagnosis not present

## 2019-07-30 DIAGNOSIS — E1122 Type 2 diabetes mellitus with diabetic chronic kidney disease: Secondary | ICD-10-CM | POA: Diagnosis not present

## 2019-07-30 DIAGNOSIS — N182 Chronic kidney disease, stage 2 (mild): Secondary | ICD-10-CM | POA: Diagnosis not present

## 2019-07-30 DIAGNOSIS — K5900 Constipation, unspecified: Secondary | ICD-10-CM | POA: Diagnosis not present

## 2019-07-30 DIAGNOSIS — F419 Anxiety disorder, unspecified: Secondary | ICD-10-CM | POA: Diagnosis not present

## 2019-07-30 DIAGNOSIS — H3530 Unspecified macular degeneration: Secondary | ICD-10-CM | POA: Diagnosis not present

## 2019-07-30 DIAGNOSIS — E519 Thiamine deficiency, unspecified: Secondary | ICD-10-CM | POA: Diagnosis not present

## 2019-07-30 DIAGNOSIS — N390 Urinary tract infection, site not specified: Secondary | ICD-10-CM | POA: Diagnosis not present

## 2019-07-30 DIAGNOSIS — Z7984 Long term (current) use of oral hypoglycemic drugs: Secondary | ICD-10-CM | POA: Diagnosis not present

## 2019-07-30 DIAGNOSIS — D519 Vitamin B12 deficiency anemia, unspecified: Secondary | ICD-10-CM | POA: Diagnosis not present

## 2019-07-30 DIAGNOSIS — F331 Major depressive disorder, recurrent, moderate: Secondary | ICD-10-CM | POA: Diagnosis not present

## 2019-07-30 DIAGNOSIS — I129 Hypertensive chronic kidney disease with stage 1 through stage 4 chronic kidney disease, or unspecified chronic kidney disease: Secondary | ICD-10-CM | POA: Diagnosis not present

## 2019-07-30 DIAGNOSIS — R1312 Dysphagia, oropharyngeal phase: Secondary | ICD-10-CM | POA: Diagnosis not present

## 2019-07-30 DIAGNOSIS — Z9181 History of falling: Secondary | ICD-10-CM | POA: Diagnosis not present

## 2019-07-30 DIAGNOSIS — M4690 Unspecified inflammatory spondylopathy, site unspecified: Secondary | ICD-10-CM | POA: Diagnosis not present

## 2019-08-04 DIAGNOSIS — F418 Other specified anxiety disorders: Secondary | ICD-10-CM | POA: Diagnosis not present

## 2019-08-04 DIAGNOSIS — D518 Other vitamin B12 deficiency anemias: Secondary | ICD-10-CM | POA: Diagnosis not present

## 2019-08-04 DIAGNOSIS — E518 Other manifestations of thiamine deficiency: Secondary | ICD-10-CM | POA: Diagnosis not present

## 2019-08-16 DIAGNOSIS — F331 Major depressive disorder, recurrent, moderate: Secondary | ICD-10-CM | POA: Diagnosis not present

## 2019-08-16 DIAGNOSIS — D6489 Other specified anemias: Secondary | ICD-10-CM | POA: Diagnosis not present

## 2019-08-16 DIAGNOSIS — K5909 Other constipation: Secondary | ICD-10-CM | POA: Diagnosis not present

## 2019-08-16 DIAGNOSIS — F418 Other specified anxiety disorders: Secondary | ICD-10-CM | POA: Diagnosis not present

## 2019-08-16 DIAGNOSIS — I10 Essential (primary) hypertension: Secondary | ICD-10-CM | POA: Diagnosis not present

## 2019-08-16 DIAGNOSIS — R1312 Dysphagia, oropharyngeal phase: Secondary | ICD-10-CM | POA: Diagnosis not present

## 2019-08-16 DIAGNOSIS — E0822 Diabetes mellitus due to underlying condition with diabetic chronic kidney disease: Secondary | ICD-10-CM | POA: Diagnosis not present

## 2019-08-16 DIAGNOSIS — N182 Chronic kidney disease, stage 2 (mild): Secondary | ICD-10-CM | POA: Diagnosis not present

## 2019-08-17 DIAGNOSIS — F418 Other specified anxiety disorders: Secondary | ICD-10-CM | POA: Diagnosis not present

## 2019-08-17 DIAGNOSIS — I10 Essential (primary) hypertension: Secondary | ICD-10-CM | POA: Diagnosis not present

## 2019-08-24 DIAGNOSIS — F331 Major depressive disorder, recurrent, moderate: Secondary | ICD-10-CM | POA: Diagnosis not present

## 2019-08-24 DIAGNOSIS — F064 Anxiety disorder due to known physiological condition: Secondary | ICD-10-CM | POA: Diagnosis not present

## 2019-08-29 DIAGNOSIS — M4690 Unspecified inflammatory spondylopathy, site unspecified: Secondary | ICD-10-CM | POA: Diagnosis not present

## 2019-08-29 DIAGNOSIS — M4326 Fusion of spine, lumbar region: Secondary | ICD-10-CM | POA: Diagnosis not present

## 2019-08-29 DIAGNOSIS — N390 Urinary tract infection, site not specified: Secondary | ICD-10-CM | POA: Diagnosis not present

## 2019-08-29 DIAGNOSIS — K5900 Constipation, unspecified: Secondary | ICD-10-CM | POA: Diagnosis not present

## 2019-08-29 DIAGNOSIS — N182 Chronic kidney disease, stage 2 (mild): Secondary | ICD-10-CM | POA: Diagnosis not present

## 2019-08-29 DIAGNOSIS — L299 Pruritus, unspecified: Secondary | ICD-10-CM | POA: Diagnosis not present

## 2019-08-29 DIAGNOSIS — F331 Major depressive disorder, recurrent, moderate: Secondary | ICD-10-CM | POA: Diagnosis not present

## 2019-08-29 DIAGNOSIS — D519 Vitamin B12 deficiency anemia, unspecified: Secondary | ICD-10-CM | POA: Diagnosis not present

## 2019-08-29 DIAGNOSIS — R1312 Dysphagia, oropharyngeal phase: Secondary | ICD-10-CM | POA: Diagnosis not present

## 2019-08-29 DIAGNOSIS — E519 Thiamine deficiency, unspecified: Secondary | ICD-10-CM | POA: Diagnosis not present

## 2019-08-29 DIAGNOSIS — Z7984 Long term (current) use of oral hypoglycemic drugs: Secondary | ICD-10-CM | POA: Diagnosis not present

## 2019-08-29 DIAGNOSIS — Z9181 History of falling: Secondary | ICD-10-CM | POA: Diagnosis not present

## 2019-08-29 DIAGNOSIS — E1122 Type 2 diabetes mellitus with diabetic chronic kidney disease: Secondary | ICD-10-CM | POA: Diagnosis not present

## 2019-08-29 DIAGNOSIS — H3530 Unspecified macular degeneration: Secondary | ICD-10-CM | POA: Diagnosis not present

## 2019-08-29 DIAGNOSIS — I129 Hypertensive chronic kidney disease with stage 1 through stage 4 chronic kidney disease, or unspecified chronic kidney disease: Secondary | ICD-10-CM | POA: Diagnosis not present

## 2019-08-29 DIAGNOSIS — F419 Anxiety disorder, unspecified: Secondary | ICD-10-CM | POA: Diagnosis not present

## 2019-08-30 DIAGNOSIS — M4690 Unspecified inflammatory spondylopathy, site unspecified: Secondary | ICD-10-CM | POA: Diagnosis not present

## 2019-08-30 DIAGNOSIS — E1122 Type 2 diabetes mellitus with diabetic chronic kidney disease: Secondary | ICD-10-CM | POA: Diagnosis not present

## 2019-08-30 DIAGNOSIS — N390 Urinary tract infection, site not specified: Secondary | ICD-10-CM | POA: Diagnosis not present

## 2019-08-30 DIAGNOSIS — M79675 Pain in left toe(s): Secondary | ICD-10-CM | POA: Diagnosis not present

## 2019-08-30 DIAGNOSIS — F331 Major depressive disorder, recurrent, moderate: Secondary | ICD-10-CM | POA: Diagnosis not present

## 2019-08-30 DIAGNOSIS — B351 Tinea unguium: Secondary | ICD-10-CM | POA: Diagnosis not present

## 2019-08-30 DIAGNOSIS — M4326 Fusion of spine, lumbar region: Secondary | ICD-10-CM | POA: Diagnosis not present

## 2019-08-30 DIAGNOSIS — M79674 Pain in right toe(s): Secondary | ICD-10-CM | POA: Diagnosis not present

## 2019-08-30 DIAGNOSIS — I129 Hypertensive chronic kidney disease with stage 1 through stage 4 chronic kidney disease, or unspecified chronic kidney disease: Secondary | ICD-10-CM | POA: Diagnosis not present
